# Patient Record
Sex: Female | Born: 1955
Health system: Southern US, Community
[De-identification: ages and names within clinical notes are randomized; demographics above are authoritative.]

## PROBLEM LIST (undated history)

## (undated) ENCOUNTER — Ambulatory Visit: Payer: Medicare HMO | Source: Home / Self Care

## (undated) DIAGNOSIS — I341 Nonrheumatic mitral (valve) prolapse: Secondary | ICD-10-CM

## (undated) DIAGNOSIS — I10 Essential (primary) hypertension: Secondary | ICD-10-CM

## (undated) DIAGNOSIS — M1990 Unspecified osteoarthritis, unspecified site: Secondary | ICD-10-CM

## (undated) DIAGNOSIS — M75100 Unspecified rotator cuff tear or rupture of unspecified shoulder, not specified as traumatic: Secondary | ICD-10-CM

## (undated) DIAGNOSIS — T7840XA Allergy, unspecified, initial encounter: Secondary | ICD-10-CM

## (undated) DIAGNOSIS — J45909 Unspecified asthma, uncomplicated: Secondary | ICD-10-CM

## (undated) HISTORY — PX: TONSILLECTOMY: SUR1361

## (undated) HISTORY — DX: Unspecified rotator cuff tear or rupture of unspecified shoulder, not specified as traumatic: M75.100

## (undated) HISTORY — PX: ABDOMINAL HYSTERECTOMY: SHX81

## (undated) HISTORY — PX: RETAINED PLACENTA REMOVAL: SHX2336

## (undated) HISTORY — PX: COLONOSCOPY: SHX174

---

## 1998-01-13 ENCOUNTER — Ambulatory Visit (HOSPITAL_COMMUNITY): Admission: RE | Admit: 1998-01-13 | Discharge: 1998-01-13 | Payer: Self-pay | Admitting: Obstetrics

## 1998-07-22 ENCOUNTER — Emergency Department (HOSPITAL_COMMUNITY): Admission: EM | Admit: 1998-07-22 | Discharge: 1998-07-22 | Payer: Self-pay | Admitting: Emergency Medicine

## 1998-07-22 ENCOUNTER — Encounter: Payer: Self-pay | Admitting: Emergency Medicine

## 1999-01-22 ENCOUNTER — Encounter: Payer: Self-pay | Admitting: Obstetrics

## 1999-01-22 ENCOUNTER — Ambulatory Visit (HOSPITAL_COMMUNITY): Admission: RE | Admit: 1999-01-22 | Discharge: 1999-01-22 | Payer: Self-pay | Admitting: Obstetrics

## 1999-01-27 ENCOUNTER — Encounter: Payer: Self-pay | Admitting: Obstetrics

## 1999-01-27 ENCOUNTER — Ambulatory Visit (HOSPITAL_COMMUNITY): Admission: RE | Admit: 1999-01-27 | Discharge: 1999-01-27 | Payer: Self-pay | Admitting: Obstetrics

## 2000-02-08 ENCOUNTER — Other Ambulatory Visit: Admission: RE | Admit: 2000-02-08 | Discharge: 2000-02-08 | Payer: Self-pay | Admitting: Obstetrics and Gynecology

## 2000-10-31 ENCOUNTER — Emergency Department (HOSPITAL_COMMUNITY): Admission: EM | Admit: 2000-10-31 | Discharge: 2000-10-31 | Payer: Self-pay | Admitting: Emergency Medicine

## 2001-03-31 ENCOUNTER — Ambulatory Visit (HOSPITAL_COMMUNITY): Admission: RE | Admit: 2001-03-31 | Discharge: 2001-03-31 | Payer: Self-pay | Admitting: Obstetrics

## 2001-03-31 ENCOUNTER — Encounter: Payer: Self-pay | Admitting: Obstetrics

## 2002-01-02 ENCOUNTER — Emergency Department (HOSPITAL_COMMUNITY): Admission: EM | Admit: 2002-01-02 | Discharge: 2002-01-02 | Payer: Self-pay | Admitting: Emergency Medicine

## 2002-04-02 ENCOUNTER — Ambulatory Visit (HOSPITAL_COMMUNITY): Admission: RE | Admit: 2002-04-02 | Discharge: 2002-04-02 | Payer: Self-pay | Admitting: Obstetrics

## 2002-04-02 ENCOUNTER — Encounter: Payer: Self-pay | Admitting: Obstetrics

## 2002-10-02 ENCOUNTER — Encounter (INDEPENDENT_AMBULATORY_CARE_PROVIDER_SITE_OTHER): Payer: Self-pay | Admitting: Specialist

## 2002-10-02 ENCOUNTER — Ambulatory Visit (HOSPITAL_COMMUNITY): Admission: RE | Admit: 2002-10-02 | Discharge: 2002-10-02 | Payer: Self-pay | Admitting: Obstetrics

## 2003-01-16 ENCOUNTER — Encounter: Payer: Self-pay | Admitting: Cardiology

## 2003-01-16 ENCOUNTER — Encounter: Admission: RE | Admit: 2003-01-16 | Discharge: 2003-01-16 | Payer: Self-pay | Admitting: Cardiology

## 2003-02-01 ENCOUNTER — Ambulatory Visit (HOSPITAL_BASED_OUTPATIENT_CLINIC_OR_DEPARTMENT_OTHER): Admission: RE | Admit: 2003-02-01 | Discharge: 2003-02-01 | Payer: Self-pay | Admitting: Cardiology

## 2003-04-12 ENCOUNTER — Emergency Department (HOSPITAL_COMMUNITY): Admission: EM | Admit: 2003-04-12 | Discharge: 2003-04-12 | Payer: Self-pay

## 2003-04-17 ENCOUNTER — Ambulatory Visit (HOSPITAL_COMMUNITY): Admission: RE | Admit: 2003-04-17 | Discharge: 2003-04-17 | Payer: Self-pay | Admitting: Obstetrics

## 2003-08-30 ENCOUNTER — Emergency Department (HOSPITAL_COMMUNITY): Admission: EM | Admit: 2003-08-30 | Discharge: 2003-08-30 | Payer: Self-pay | Admitting: Emergency Medicine

## 2003-12-05 ENCOUNTER — Inpatient Hospital Stay (HOSPITAL_COMMUNITY): Admission: RE | Admit: 2003-12-05 | Discharge: 2003-12-08 | Payer: Self-pay | Admitting: Obstetrics

## 2003-12-05 ENCOUNTER — Encounter (INDEPENDENT_AMBULATORY_CARE_PROVIDER_SITE_OTHER): Payer: Self-pay | Admitting: *Deleted

## 2004-05-06 ENCOUNTER — Ambulatory Visit (HOSPITAL_COMMUNITY): Admission: RE | Admit: 2004-05-06 | Discharge: 2004-05-06 | Payer: Self-pay | Admitting: Obstetrics

## 2004-06-19 ENCOUNTER — Emergency Department (HOSPITAL_COMMUNITY): Admission: EM | Admit: 2004-06-19 | Discharge: 2004-06-19 | Payer: Self-pay | Admitting: Family Medicine

## 2004-08-15 ENCOUNTER — Encounter: Admission: RE | Admit: 2004-08-15 | Discharge: 2004-08-15 | Payer: Self-pay | Admitting: Cardiology

## 2006-01-13 ENCOUNTER — Ambulatory Visit (HOSPITAL_COMMUNITY): Admission: RE | Admit: 2006-01-13 | Discharge: 2006-01-13 | Payer: Self-pay | Admitting: Obstetrics

## 2006-10-25 ENCOUNTER — Emergency Department (HOSPITAL_COMMUNITY): Admission: EM | Admit: 2006-10-25 | Discharge: 2006-10-25 | Payer: Self-pay | Admitting: Family Medicine

## 2007-02-17 ENCOUNTER — Ambulatory Visit (HOSPITAL_COMMUNITY): Admission: RE | Admit: 2007-02-17 | Discharge: 2007-02-17 | Payer: Self-pay | Admitting: Cardiology

## 2007-04-11 ENCOUNTER — Encounter: Admission: RE | Admit: 2007-04-11 | Discharge: 2007-04-11 | Payer: Self-pay | Admitting: Cardiology

## 2007-09-06 ENCOUNTER — Encounter (HOSPITAL_COMMUNITY): Admission: RE | Admit: 2007-09-06 | Discharge: 2007-09-07 | Payer: Self-pay | Admitting: Cardiology

## 2008-07-12 ENCOUNTER — Emergency Department (HOSPITAL_COMMUNITY): Admission: EM | Admit: 2008-07-12 | Discharge: 2008-07-12 | Payer: Self-pay | Admitting: Emergency Medicine

## 2010-06-14 ENCOUNTER — Emergency Department (HOSPITAL_COMMUNITY)
Admission: EM | Admit: 2010-06-14 | Discharge: 2010-06-14 | Payer: Self-pay | Source: Home / Self Care | Admitting: Emergency Medicine

## 2010-06-14 ENCOUNTER — Encounter: Payer: Self-pay | Admitting: Obstetrics

## 2010-06-15 ENCOUNTER — Encounter: Payer: Self-pay | Admitting: Cardiology

## 2010-06-22 ENCOUNTER — Observation Stay (HOSPITAL_COMMUNITY)
Admission: EM | Admit: 2010-06-22 | Discharge: 2010-06-23 | Payer: Self-pay | Source: Home / Self Care | Admitting: Emergency Medicine

## 2010-06-22 LAB — POCT CARDIAC MARKERS
CKMB, poc: <1 ng/mL — ABNORMAL LOW (ref 1.0–8.0)
CKMB, poc: <1 ng/mL — ABNORMAL LOW (ref 1.0–8.0)
CKMB, poc: <1 ng/mL — ABNORMAL LOW (ref 1.0–8.0)
Myoglobin, poc: 50 ng/mL (ref 12–200)
Myoglobin, poc: 45.8 ng/mL (ref 12–200)
Myoglobin, poc: 43.4 ng/mL (ref 12–200)
Troponin i, poc: <0.05 ng/mL (ref 0.00–0.09)
Troponin i, poc: <0.05 ng/mL (ref 0.00–0.09)
Troponin i, poc: <0.05 ng/mL (ref 0.00–0.09)

## 2010-06-22 LAB — CBC
HCT: 38 % (ref 36.0–46.0)
Hemoglobin: 13 g/dL (ref 12.0–15.0)
MCH: 28 pg (ref 26.0–34.0)
MCHC: 34.2 g/dL (ref 30.0–36.0)
MCV: 81.9 fL (ref 78.0–100.0)
Platelets: 199 10*3/uL (ref 150–400)
RBC: 4.64 MIL/uL (ref 3.87–5.11)
RDW: 14.6 % (ref 11.5–15.5)
WBC: 12.2 10*3/uL — ABNORMAL HIGH (ref 4.0–10.5)

## 2010-06-22 LAB — BASIC METABOLIC PANEL
BUN: 15 mg/dL (ref 6–23)
CO2: 32 mEq/L (ref 19–32)
Calcium: 8.9 mg/dL (ref 8.4–10.5)
Chloride: 100 mEq/L (ref 96–112)
Creatinine, Ser: 0.98 mg/dL (ref 0.4–1.2)
GFR calc Af Amer: >60 mL/min (ref >60)
GFR calc non Af Amer: 59 mL/min — ABNORMAL LOW (ref >60)
Glucose, Bld: 92 mg/dL (ref 70–99)
Potassium: 4.3 mEq/L (ref 3.5–5.1)
Sodium: 140 mEq/L (ref 135–145)

## 2010-06-22 LAB — DIFFERENTIAL
Basophils Absolute: 0 10*3/uL (ref 0.0–0.1)
Basophils Relative: 0 % (ref 0–1)
Eosinophils Absolute: 0.8 10*3/uL — ABNORMAL HIGH (ref 0.0–0.7)
Eosinophils Relative: 7 % — ABNORMAL HIGH (ref 0–5)
Lymphocytes Relative: 31 % (ref 12–46)
Lymphs Abs: 3.8 10*3/uL (ref 0.7–4.0)
Monocytes Absolute: 0.8 10*3/uL (ref 0.1–1.0)
Monocytes Relative: 7 % (ref 3–12)
Neutro Abs: 6.7 10*3/uL (ref 1.7–7.7)
Neutrophils Relative %: 55 % (ref 43–77)

## 2010-10-09 NOTE — Discharge Summary (Signed)
NAME:  DHALIA, ZINGARO                         ACCOUNT NO.:  [id number]   MEDICAL RECORD NO.:  [id number]                   PATIENT TYPE:  INP   LOCATION:  9303                                 FACILITY:  [facility]   PHYSICIAN:  Kathreen Cosier, M.D.           DATE OF BIRTH:  03/12/1956   DATE OF ADMISSION:  12/05/2003  DATE OF DISCHARGE:  12/08/2003                                 DISCHARGE SUMMARY   The patient is a 55 year old gravida 2, para 2-0-0-2 with a history of  dysfunctional uterine bleeding and myoma uteri who was in for TAH/BSO.  On  admission her hemoglobin was 13.9, postoperative 12.4.  Platelets 247,  postoperative 197.  White count 8.1.  PT/PTT normal.  INR 1.  Sodium 137,  potassium 4, chloride 101, CO2 28, BUN 5, creatinine 0.9.  Urine was  negative.  She was O+.  The patient underwent a TAH/BSO.  Blood loss was  approximately 100 mL.  Postoperatively she did well.  She was discharged  home on the third postoperative day, ambulatory, on a regular diet, on Tylox  for pain.  To see me in four weeks.   DISCHARGE DIAGNOSES:  Status post total abdominal hysterectomy, bilateral  salpingo-oophorectomy for myoma uteri.                                               Kathreen Cosier, M.D.    BAM/MEDQ  D:  12/08/2003  T:  12/08/2003  Job:  [id number]

## 2010-10-09 NOTE — Op Note (Signed)
   NAME:  Ellen Nelson, Ellen Nelson                         ACCOUNT NO.:  [id number]   MEDICAL RECORD NO.:  [id number]                   PATIENT TYPE:  AMB   LOCATION:  SDC                                  FACILITY:  [facility]   PHYSICIAN:  Kathreen Cosier, M.D.           DATE OF BIRTH:  02-24-1956   DATE OF PROCEDURE:  10/02/2002  DATE OF DISCHARGE:                                 OPERATIVE REPORT   PREOPERATIVE DIAGNOSIS:  Dysfunctional uterine bleeding.   POSTOPERATIVE DIAGNOSIS:  Subserosal myoma.   DESCRIPTION OF PROCEDURE:  Under general anesthesia, the patient was in  lithotomy position, perineum and vagina were prepped and draped, bladder  emptied with a straight catheter.  Bimanual exam revealed the uterus to be  12-14 weeks' size with multiple myomas.  A bivalve speculum was placed in  the vagina.  Anterior lip of the cervix grasped with a tenaculum and the  cervix was curetted with small amount of tissue obtained.  Endometrial  cavity sounded to 10 cm, and then the cervix was dilated to a #27 Shawnie Pons.  A  5 mm hysteroscope was inserted and the cavity distended with 3% sorbitol  solution.  The cavity appeared to be normal.  The scope was removed and  sharp curettage was performed.  There were no polyps or myomas in the  cavity, and a large amount of tissue was obtained.  After the sharp  curettage, the scope was reinserted, the cavity normal.  The fluid deficit  was 70 mL.  The patient tolerated the procedure well, was taken to the  recovery room in good condition.                                               Kathreen Cosier, M.D.    BAM/MEDQ  D:  10/02/2002  T:  10/03/2002  Job:  [id number]

## 2010-10-09 NOTE — Op Note (Signed)
NAME:  Ellen Nelson, Ellen Nelson                         ACCOUNT NO.:  [id number]   MEDICAL RECORD NO.:  [id number]                   PATIENT TYPE:  INP   LOCATION:  9303                                 [facility]ACILITY:  [facility]   PHYSICIAN:  Kathreen Cosier, M.D.           DATE OF BIRTH:  15-Mar-1956   DATE OF PROCEDURE:  12/05/2003  DATE OF DISCHARGE:                                 OPERATIVE REPORT   PREOPERATIVE DIAGNOSES:  1. Myoma uteri.  2. Dysfunctional uterine bleeding.   SURGEON:  Kathreen Cosier, M.D.   FIRST ASSISTANT:  Charles A. Clearance Coots, M.D.   ANESTHESIA:  General anesthesia.   DESCRIPTION OF PROCEDURE:  The patient placed on the operating table in the  supine position.  Abdomen prepped and draped.  Bladder emptied with Foley  catheter.  Transverse suprapubic incision made and carried down to the  rectus fascia.  Fascia cleaned and incised the length of the incision. Recti  muscles retracted laterally.  Peritoneum incised longitudinally.  The uterus  was enlarged with multiple myomas.  Ovaries were normal.  The right round  ligament was grasped with a Kelly clamp, cut and suture ligated with #1  chromic.  Procedure done in a similar fashion on the other side.  Using the  Metzenbaum scissors, the bladder flap was developed and the bladder  dissected off the cervix.  The right infundibulopelvic ligament was grasped  with a Kelly clamp, cut and suture ligated with #1 chromic.  Procedure done  in similar fashion on the other side.  Uterine vessels were skeletonized  bilaterally, doubly clamped on the right with Heaney clamps, cut and suture  ligated x2 with #1 chromic.  Procedure done in a similar fashion on the  other side.  Cardinal and uterosacral ligaments grasped with straight Kocher  clamps on the right, cut and suture ligated with #1 chromic.  Procedure done  in similar fashion on the other side.  Specimen consisting of uterus, tubes  and ovaries removed at the cervicovaginal  junction with Mayo scissors.  Modified Richardson sutures were placed in the angles of the vagina and then  the vaginal vault run with interlocking sutures of #1 chromic.  Hemostasis  was satisfactory.  The operative site was reperitonealized with 2-0 chromic.  Blood loss was 100 mL. The lap and sponge counts were correct.  The abdomen  closed in layers, peritoneum with continuous suture of 0 chromic, fascia  with continuous suture of 0 Dexon and the skin closed with subcuticular  suture of 4-0 Monocryl.  The patient tolerated the procedure well and was  taken to the recovery room in good condition.                                               Kathreen Cosier, M.D.  BAM/MEDQ  D:  12/05/2003  T:  12/05/2003  Job:  [id number]

## 2012-07-12 ENCOUNTER — Encounter (HOSPITAL_COMMUNITY): Payer: Self-pay

## 2012-07-12 ENCOUNTER — Emergency Department (INDEPENDENT_AMBULATORY_CARE_PROVIDER_SITE_OTHER)
Admission: EM | Admit: 2012-07-12 | Discharge: 2012-07-12 | Disposition: A | Discharge status: Home / Self Care | Payer: Self-pay | Source: Home / Self Care | Attending: Emergency Medicine | Admitting: Emergency Medicine

## 2012-07-12 DIAGNOSIS — J40 Bronchitis, not specified as acute or chronic: Secondary | ICD-10-CM

## 2012-07-12 DIAGNOSIS — J45909 Unspecified asthma, uncomplicated: Secondary | ICD-10-CM

## 2012-07-12 MED ORDER — SILVER SULFADIAZINE 1 % EX CREA: TOPICAL_CREAM | Freq: Every day | CUTANEOUS | Status: DC

## 2012-07-12 MED ORDER — PREDNISONE 20 MG PO TABS: 40.0000 mg | ORAL_TABLET | Freq: Every day | ORAL | Status: AC

## 2012-07-12 MED ORDER — AMOXICILLIN 500 MG PO CAPS: 500.0000 mg | ORAL_CAPSULE | Freq: Three times a day (TID) | ORAL | Status: AC

## 2012-07-12 MED ORDER — ALBUTEROL SULFATE HFA 108 (90 BASE) MCG/ACT IN AERS: 1.0000 | INHALATION_SPRAY | Freq: Four times a day (QID) | RESPIRATORY_TRACT | Status: DC | PRN

## 2012-07-12 NOTE — ED Notes (Signed)
Vitals obtained by xray student 

## 2012-07-12 NOTE — ED Provider Notes (Signed)
History     CSN: [id number]  Arrival date & time 07/12/12  1635   First MD Initiated Contact with Patient 07/12/12 1723      Chief Complaint  Patient presents with  . Cough    (Consider location/radiation/quality/duration/timing/severity/associated sxs/prior treatment) HPI Comments: Patient presents urgent care describing that she's been having cough tightness and wheezing for the last 3 weeks. Now she is coughing up yellow phlegm. His wheezing at night. Have some congestion and pressure in both of her sinuses some intermittent ear pains. Denies any fevers. And at this point has no shortness of breath.  Patient is a 57 y.o. female presenting with cough. The history is provided by the patient.  Cough Cough characteristics:  Productive Sputum characteristics:  Yellow Severity:  Moderate Onset quality:  Gradual Timing:  Intermittent Progression:  Waxing and waning Chronicity:  New Context: not exposure to allergens   Relieved by:  Nothing Worsened by:  Nothing tried Ineffective treatments:  None tried Associated symptoms: ear pain, rhinorrhea, shortness of breath, sinus congestion and wheezing   Associated symptoms: no chest pain, no chills, no diaphoresis, no fever and no rash     History reviewed. No pertinent past medical history.  History reviewed. No pertinent past surgical history.  History reviewed. No pertinent family history.  History  Substance Use Topics  . Smoking status: Not on file  . Smokeless tobacco: Not on file  . Alcohol Use: Not on file    OB History   Grav Para Term Preterm Abortions TAB SAB Ect Mult Living                  Review of Systems  Constitutional: Negative for fever, chills, diaphoresis, activity change, appetite change and fatigue.  HENT: Positive for ear pain, congestion and rhinorrhea. Negative for facial swelling, sneezing, neck pain, neck stiffness and postnasal drip.   Respiratory: Positive for cough, shortness of breath and  wheezing.   Cardiovascular: Negative for chest pain, palpitations and leg swelling.  Genitourinary: Negative for dysuria.  Skin: Negative for color change, pallor and rash.    Allergies  Review of patient's allergies indicates no known allergies.  Home Medications   Current Outpatient Rx  Name  Route  Sig  Dispense  Refill  . albuterol (PROVENTIL HFA;VENTOLIN HFA) 108 (90 BASE) MCG/ACT inhaler   Inhalation   Inhale 1-2 puffs into the lungs every 6 (six) hours as needed for wheezing.   1 Inhaler   0   . amoxicillin (AMOXIL) 500 MG capsule   Oral   Take 1 capsule (500 mg total) by mouth 3 (three) times daily.   30 capsule   0   . predniSONE (DELTASONE) 20 MG tablet   Oral   Take 2 tablets (40 mg total) by mouth daily. 2 tablets daily for 5 days   10 tablet   0     BP 157/90  Pulse 74  Temp(Src) 98.2 F (36.8 C) (Oral)  Resp 14  SpO2 99%  Physical Exam  Nursing note and vitals reviewed. Constitutional: Vital signs are normal. She appears well-developed and well-nourished.  Non-toxic appearance. She does not have a sickly appearance. She does not appear ill. No distress.  HENT:  Head: Normocephalic.  Eyes: Conjunctivae are normal. Right eye exhibits no discharge. Left eye exhibits no discharge. No scleral icterus.  Neck: Neck supple. No JVD present.  Pulmonary/Chest: Effort normal and breath sounds normal. No respiratory distress. She has no wheezes. She has no rales.  She exhibits no tenderness.  Abdominal: There is no tenderness.  Skin: No rash noted. No erythema.    ED Course  Procedures (including critical care time)  Labs Reviewed - No data to display No results found.   1. Bronchitis   2. Asthma       MDM  Asthmatic bronchitis. Patient had been prescribed bronchodilator, short course of prednisone for 5 days along with antibiotic cycle patient has been symptomatic for 3 weeks. No respiratory distress        Jimmie Molly, MD 07/12/12 [phone]

## 2012-07-12 NOTE — ED Notes (Signed)
C/o cough and congestion w yellow secretions off and on for past 3 weeks; looses voice : NAD at present

## 2012-07-26 ENCOUNTER — Encounter (HOSPITAL_COMMUNITY): Payer: Self-pay

## 2012-07-26 ENCOUNTER — Encounter (HOSPITAL_COMMUNITY): Payer: Self-pay | Admitting: *Deleted

## 2012-07-26 ENCOUNTER — Emergency Department (HOSPITAL_COMMUNITY)
Admission: EM | Admit: 2012-07-26 | Discharge: 2012-07-26 | Disposition: A | Discharge status: Home / Self Care | Payer: Self-pay | Attending: Emergency Medicine | Admitting: Emergency Medicine

## 2012-07-26 ENCOUNTER — Emergency Department (HOSPITAL_COMMUNITY): Payer: Self-pay

## 2012-07-26 ENCOUNTER — Emergency Department (INDEPENDENT_AMBULATORY_CARE_PROVIDER_SITE_OTHER)
Admission: EM | Admit: 2012-07-26 | Discharge: 2012-07-26 | Disposition: A | Discharge status: Other Acute Inpatient Hospital | Payer: Self-pay | Source: Home / Self Care

## 2012-07-26 DIAGNOSIS — R12 Heartburn: Secondary | ICD-10-CM | POA: Insufficient documentation

## 2012-07-26 DIAGNOSIS — Z87891 Personal history of nicotine dependence: Secondary | ICD-10-CM | POA: Insufficient documentation

## 2012-07-26 DIAGNOSIS — R079 Chest pain, unspecified: Secondary | ICD-10-CM

## 2012-07-26 DIAGNOSIS — R1013 Epigastric pain: Secondary | ICD-10-CM | POA: Insufficient documentation

## 2012-07-26 DIAGNOSIS — R0789 Other chest pain: Secondary | ICD-10-CM | POA: Insufficient documentation

## 2012-07-26 DIAGNOSIS — Z8679 Personal history of other diseases of the circulatory system: Secondary | ICD-10-CM | POA: Insufficient documentation

## 2012-07-26 HISTORY — DX: Nonrheumatic mitral (valve) prolapse: I34.1

## 2012-07-26 LAB — CBC WITH DIFFERENTIAL/PLATELET
Basophils Absolute: 0 10*3/uL (ref 0.0–0.1)
Basophils Relative: 0 % (ref 0–1)
Eosinophils Absolute: 0.3 10*3/uL (ref 0.0–0.7)
Eosinophils Relative: 3 % (ref 0–5)
HCT: 38.2 % (ref 36.0–46.0)
Hemoglobin: 13.5 g/dL (ref 12.0–15.0)
Lymphocytes Relative: 33 % (ref 12–46)
Lymphs Abs: 2.8 10*3/uL (ref 0.7–4.0)
MCH: 29 pg (ref 26.0–34.0)
MCHC: 35.3 g/dL (ref 30.0–36.0)
MCV: 82.2 fL (ref 78.0–100.0)
Monocytes Absolute: 0.8 10*3/uL (ref 0.1–1.0)
Monocytes Relative: 9 % (ref 3–12)
Neutro Abs: 4.8 10*3/uL (ref 1.7–7.7)
Neutrophils Relative %: 55 % (ref 43–77)
Platelets: 229 10*3/uL (ref 150–400)
RBC: 4.65 MIL/uL (ref 3.87–5.11)
RDW: 14.2 % (ref 11.5–15.5)
WBC: 8.6 10*3/uL (ref 4.0–10.5)

## 2012-07-26 LAB — COMPREHENSIVE METABOLIC PANEL
ALT: 18 U/L (ref 0–35)
AST: 27 U/L (ref 0–37)
Albumin: 4.1 g/dL (ref 3.5–5.2)
Alkaline Phosphatase: 110 U/L (ref 39–117)
BUN: 11 mg/dL (ref 6–23)
CO2: 29 mEq/L (ref 19–32)
Calcium: 9.7 mg/dL (ref 8.4–10.5)
Chloride: 99 mEq/L (ref 96–112)
Creatinine, Ser: 0.85 mg/dL (ref 0.50–1.10)
GFR calc Af Amer: 87 mL/min — ABNORMAL LOW (ref >90)
GFR calc non Af Amer: 75 mL/min — ABNORMAL LOW (ref >90)
Glucose, Bld: 92 mg/dL (ref 70–99)
Potassium: 3.8 mEq/L (ref 3.5–5.1)
Sodium: 139 mEq/L (ref 135–145)
Total Bilirubin: 0.4 mg/dL (ref 0.3–1.2)
Total Protein: 7.4 g/dL (ref 6.0–8.3)

## 2012-07-26 LAB — TROPONIN I: Troponin I: <0.3 ng/mL (ref <0.30)

## 2012-07-26 MED ORDER — GI COCKTAIL ~~LOC~~
30.0000 mL | Freq: Once | ORAL | Status: AC
  Administered 2012-07-26: 30 mL via ORAL

## 2012-07-26 MED ORDER — ASPIRIN 81 MG PO CHEW
CHEWABLE_TABLET | ORAL | Status: AC
  Filled 2012-07-26: qty 4

## 2012-07-26 MED ORDER — GI COCKTAIL ~~LOC~~
ORAL | Status: AC
  Filled 2012-07-26: qty 30

## 2012-07-26 MED ORDER — ASPIRIN EC 325 MG PO TBEC
325.0000 mg | DELAYED_RELEASE_TABLET | Freq: Once | ORAL | Status: AC
  Administered 2012-07-26: 325 mg via ORAL

## 2012-07-26 NOTE — ED Provider Notes (Signed)
History     CSN: [id number]  Arrival date & time 07/26/12  1504   First MD Initiated Contact with Patient 07/26/12 1907      Chief Complaint  Patient presents with  . Chest Pain    (Consider location/radiation/quality/duration/timing/severity/associated sxs/prior treatment) Patient is a 57 y.o. female presenting with chest pain.  Chest Pain  Pt reports she began to have epigastric burning pain while out of town 4-5 days ago. States pain moved into her chest at times, feels like bad heart burn/indigestion but not much improvement with numerous OTC remedies. She got a box of Prevacid from the pharmacy today but did not start it due to warnings and instead went to the [facility] to be evaluated. She was sent to the ED for further eval after neg EKG, ASA and GI cocktail. She is pain free now. No SOB, diaphoresis or arm pain.    Past Medical History  Diagnosis Date  . Mitral prolapse     History reviewed. No pertinent past surgical history.  No family history on file.  History  Substance Use Topics  . Smoking status: Former Games developer  . Smokeless tobacco: Not on file  . Alcohol Use: Yes    OB History   Grav Para Term Preterm Abortions TAB SAB Ect Mult Living                  Review of Systems  Cardiovascular: Positive for chest pain.   All other systems reviewed and are negative except as noted in HPI.   Allergies  Sulfa antibiotics  Home Medications  No current outpatient prescriptions on file.  BP 129/62  Pulse 61  Temp(Src) 98 F (36.7 C) (Oral)  Resp 16  SpO2 100%  Physical Exam  Nursing note and vitals reviewed. Constitutional: She is oriented to person, place, and time. She appears well-developed and well-nourished.  HENT:  Head: Normocephalic and atraumatic.  Eyes: EOM are normal. Pupils are equal, round, and reactive to light.  Neck: Normal range of motion. Neck supple.  Cardiovascular: Normal rate, normal heart sounds and intact distal pulses.    Pulmonary/Chest: Effort normal and breath sounds normal.  Abdominal: Bowel sounds are normal. She exhibits no distension. There is tenderness (mild epigastric). There is no rebound and no guarding.  Musculoskeletal: Normal range of motion. She exhibits no edema and no tenderness.  Neurological: She is alert and oriented to person, place, and time. She has normal strength. No cranial nerve deficit or sensory deficit.  Skin: Skin is warm and dry. No rash noted.  Psychiatric: She has a normal mood and affect.    ED Course  Procedures (including critical care time)  Labs Reviewed  COMPREHENSIVE METABOLIC PANEL - Abnormal; Notable for the following:    GFR calc non Af Amer 75 (*)    GFR calc Af Amer 87 (*)    All other components within normal limits  CBC WITH DIFFERENTIAL  TROPONIN I   Dg Chest 2 View  07/26/2012  *RADIOLOGY REPORT*  Clinical Data: Chest pain.  CHEST - 2 VIEW  Comparison: June 22, 2010.  Findings: Cardiomediastinal silhouette appears normal.  No acute pulmonary disease is noted.  Bony thorax is intact.  IMPRESSION: No acute cardiopulmonary abnormality seen.   Original Report Authenticated By: Lupita Raider.,  M.D.      No diagnosis found.    MDM   Date: 07/26/2012  Rate: 68  Rhythm: normal sinus rhythm  QRS Axis: normal  Intervals: normal  ST/T Wave abnormalities: normal  Conduction Disutrbances: none  Narrative Interpretation: unremarkable  Pt with normal EKG and normal troponin after several days of atypical pain, more likely to be GI in etiology. She has no CAD risk factors. Advised to take PPI at home and follow up with PCP. Return to the ED for any worsening pain, SOB or for any other concerns.           Charles B. Bernette Mayers, MD 07/26/12 Serena Croissant

## 2012-07-26 NOTE — ED Notes (Signed)
States she has been having pain in her epigastric area for past few days while she was out of town for funeral. Had been up late, eating late, laying down after she ate ( not her usual habit) pain relieved somewhat when she drank "soda water" and burped. Today she has pain in her mid chest and feels dizzy. Had just finished a course of antibiotics and steroids for bronchitis Rx here at [facility]. NAD at present warm, dry , color good

## 2012-07-26 NOTE — ED Notes (Signed)
Patient Demographics  Ellen Nelson, is a 57 y.o. female  RUE:[id number]  JYN:[id number]  DOB - 1955/06/09  Chief Complaint  Patient presents with  . Chest Pain        Subjective:   Ellen Nelson today has, No headache, 3 day history of substernal chest discomfort which radiates to her left arm and neck, makes her nauseated and sweaty, today she has been getting dizzy, pain is intermittent, denies any aggravating or relieving factors, currently pain-free, No new weakness tingling or numbness, No Cough - SOB.    Objective:    Filed Vitals:   07/26/12 1434  BP: 117/89  Pulse: 87  Temp: 97.8 F (36.6 C)  TempSrc: Oral  SpO2: 99%     Exam  Awake Alert, Oriented X 3, No new F.N deficits, Normal affect Isabel.AT,PERRAL Supple Neck,No JVD, No cervical lymphadenopathy appriciated.  Symmetrical Chest wall movement, Good air movement bilaterally, CTAB RRR,No Gallops,Rubs or new Murmurs, No Parasternal Heave +ve B.Sounds, Abd Soft, Non tender, No organomegaly appriciated, No rebound - guarding or rigidity. No Cyanosis, Clubbing or edema, No new Rash or bruise      Data Review   CBC No results found for this basename: WBC, HGB, HCT, PLT, MCV, MCH, MCHC, RDW, NEUTRABS, LYMPHSABS, MONOABS, EOSABS, BASOSABS, BANDABS, BANDSABD,  in the last 168 hours  Chemistries   No results found for this basename: NA, K, CL, CO2, GLUCOSE, BUN, CREATININE, GFRCGP, CALCIUM, MG, AST, ALT, ALKPHOS, BILITOT,  in the last 168 hours ------------------------------------------------------------------------------------------------------------------ No results found for this basename: HGBA1C,  in the last 72 hours ------------------------------------------------------------------------------------------------------------------ No results found for this basename: CHOL, HDL, LDLCALC, TRIG, CHOLHDL, LDLDIRECT,  in the last 72  hours ------------------------------------------------------------------------------------------------------------------ No results found for this basename: TSH, T4TOTAL, FREET3, T3FREE, THYROIDAB,  in the last 72 hours ------------------------------------------------------------------------------------------------------------------ No results found for this basename: VITAMINB12, FOLATE, FERRITIN, TIBC, IRON, RETICCTPCT,  in the last 72 hours  Coagulation profile  No results found for this basename: INR, PROTIME,  in the last 168 hours     Prior to Admission medications   Medication Sig Start Date End Date Taking? Authorizing Provider  albuterol (PROVENTIL HFA;VENTOLIN HFA) 108 (90 BASE) MCG/ACT inhaler Inhale 1-2 puffs into the lungs every 6 (six) hours as needed for wheezing. 07/12/12   Jimmie Molly, MD     Assessment & Plan    Substernal chest pain radiating to her left arm and neck, at times to her back, pain ongoing for 3 days, however today the pain is making patient nauseated, dizzy and at times break in sweat. Pain is intermittent and she is currently pain-free. EKG is unremarkable with sinus rhythm rate 68 beats a minute QTC 4 and 18 ms. No acute ST changes, patient has significant family history of coronary artery disease in her mom and her sister in their 71s. He is a multiple pack year smoker who quit smoking 2 years ago.   This time patient will be given full dose aspirin, she is hematologically stable, will be transported immediately to [facility] next door, she at least needs serial troponins and EKG, thereafter cardiology input for further testing.    Follow-up Information   Follow up with Primary care provider. Schedule an appointment as soon as possible for a visit in 2 weeks.       Leroy Sea M.D on 07/26/2012 at 2:44 PM  Leroy Sea, MD 07/26/12 [phone]

## 2012-07-26 NOTE — ED Notes (Signed)
The pt has had lt upper chest pain since Monday and feeling like indigestion.  She has also had some dizziness.  She was sent here from [facility] had an ekg and was given 4 baby aspirin.  No acute distress

## 2012-07-26 NOTE — ED Notes (Signed)
Pt is in a gown and hooked up to the monitor. Urine sample at bedside if needed

## 2017-09-12 ENCOUNTER — Ambulatory Visit (HOSPITAL_COMMUNITY)
Admission: EM | Admit: 2017-09-12 | Discharge: 2017-09-12 | Disposition: A | Discharge status: Home / Self Care | Payer: BLUE CROSS/BLUE SHIELD | Attending: Emergency Medicine | Admitting: Emergency Medicine

## 2017-09-12 ENCOUNTER — Encounter (HOSPITAL_COMMUNITY): Payer: Self-pay

## 2017-09-12 ENCOUNTER — Ambulatory Visit (INDEPENDENT_AMBULATORY_CARE_PROVIDER_SITE_OTHER): Payer: BLUE CROSS/BLUE SHIELD

## 2017-09-12 ENCOUNTER — Other Ambulatory Visit: Payer: Self-pay

## 2017-09-12 DIAGNOSIS — H6123 Impacted cerumen, bilateral: Secondary | ICD-10-CM | POA: Diagnosis not present

## 2017-09-12 DIAGNOSIS — J209 Acute bronchitis, unspecified: Secondary | ICD-10-CM | POA: Diagnosis not present

## 2017-09-12 DIAGNOSIS — R05 Cough: Secondary | ICD-10-CM

## 2017-09-12 MED ORDER — AZITHROMYCIN 250 MG PO TABS: 250.0000 mg | ORAL_TABLET | Freq: Every day | ORAL | 0 refills | Status: DC

## 2017-09-12 NOTE — ED Provider Notes (Signed)
[facility]    CSN: [id number] Arrival date & time: 09/12/17  1926     History   Chief Complaint Chief Complaint  Patient presents with  . Cough    HPI Ellen Nelson is a 62 y.o. female.   62 year old female presents with four-week history of cough, was seen by her PCP 4 weeks ago, diagnosed with URI, started on antibiotics. She is unsure what these were. Note from her PCP not evident in epic  The history is provided by the patient.  Cough  Cough characteristics:  Productive Sputum characteristics:  Green Severity:  Moderate Onset quality:  Gradual Duration:  4 weeks Timing:  Constant Progression:  Waxing and waning Chronicity:  New Smoker: no   Context: upper respiratory infection   Context: not animal exposure, not exposure to allergens and not smoke exposure   Relieved by:  None tried Worsened by:  Nothing Ineffective treatments:  None tried Associated symptoms: no chest pain, no chills, no ear pain, no fever, no headaches, no shortness of breath, no sore throat and no wheezing   Risk factors: recent infection     Past Medical History:  Diagnosis Date  . Mitral prolapse     There are no active problems to display for this patient.   History reviewed. No pertinent surgical history.  OB History   None      Home Medications    Prior to Admission medications   Medication Sig Start Date End Date Taking? Authorizing Provider  Bioflavonoid Products (BIOFLEX PO) Take by mouth.   Yes [provider]  Cholecalciferol (VITAMIN D) 2000 units CAPS Take by mouth.   Yes [provider]  fexofenadine (ALLEGRA) 60 MG tablet Take 60 mg by mouth 2 (two) times daily.   Yes [provider]  losartan (COZAAR) 100 MG tablet Take 100 mg by mouth daily.   Yes [provider]  Omega-3 Fatty Acids (FISH OIL) 1000 MG CAPS Take by mouth.   Yes [provider]  thiamine (VITAMIN B-1) 100 MG tablet Take 100 mg by mouth  daily.   Yes [provider]  azithromycin (ZITHROMAX) 250 MG tablet Take 1 tablet (250 mg total) by mouth daily. Take first 2 tablets together, then 1 every day until finished. 09/12/17   Dorena BodoKennard, Lawrence, NP    Family History Family History  Problem Relation Age of Onset  . Dementia Father     Social History Social History   Tobacco Use  . Smoking status: Former Games developermoker  . Smokeless tobacco: Never Used  Substance Use Topics  . Alcohol use: Yes  . Drug use: Not on file     Allergies   Sulfa antibiotics   Review of Systems Review of Systems  Constitutional: Negative for chills and fever.  HENT: Negative for ear pain and sore throat.   Respiratory: Positive for cough. Negative for shortness of breath and wheezing.   Cardiovascular: Negative for chest pain.  Neurological: Negative for headaches.     Physical Exam Triage Vital Signs ED Triage Vitals  Enc Vitals Group     BP 09/12/17 1950 (!) 148/76     Pulse Rate 09/12/17 1950 86     Resp 09/12/17 1950 18     Temp 09/12/17 1950 98.7 F (37.1 C)     Temp src --      SpO2 09/12/17 1950 99 %     Weight 09/12/17 1949 219 lb (99.3 kg)     Height --  Head Circumference --      Peak Flow --      Pain Score 09/12/17 1949 0     Pain Loc --      Pain Edu? --      Excl. in GC? --    No data found.  Updated Vital Signs BP (!) 148/76   Pulse 86   Temp 98.7 F (37.1 C)   Resp 18   Wt 219 lb (99.3 kg)   SpO2 99%   Visual Acuity Right Eye Distance:   Left Eye Distance:   Bilateral Distance:    Right Eye Near:   Left Eye Near:    Bilateral Near:     Physical Exam  Constitutional: She is oriented to person, place, and time. She appears well-developed and well-nourished. No distress.  HENT:  Head: Normocephalic.  Right Ear: External ear normal.  Left Ear: External ear normal.  Mouth/Throat: Oropharynx is clear and moist.  Bilateral cerumen impaction  Eyes: Conjunctivae are normal.  Neck:  Normal range of motion.  Cardiovascular: Normal rate and regular rhythm.  Pulmonary/Chest: Effort normal and breath sounds normal.  Abdominal: Soft. Bowel sounds are normal. She exhibits no distension. There is no tenderness.  Lymphadenopathy:    She has no cervical adenopathy.  Neurological: She is alert and oriented to person, place, and time.  Skin: Skin is warm and dry. She is not diaphoretic.  Nursing note and vitals reviewed.    UC Treatments / Results  Labs (all labs ordered are listed, but only abnormal results are displayed) Labs Reviewed - No data to display  EKG None Radiology Dg Chest 2 View  Result Date: 09/12/2017 CLINICAL DATA:  Cough for 4 weeks EXAM: CHEST - 2 VIEW COMPARISON:  07/26/2012 FINDINGS: Mild bronchitic changes. No consolidation or effusion. Normal cardiomediastinal silhouette with aortic atherosclerosis. No pneumothorax. IMPRESSION: No active cardiopulmonary disease. Electronically Signed   By: Jasmine Pang M.D.   On: 09/12/2017 20:48    Procedures Procedures (including critical care time)  Medications Ordered in UC Medications - No data to display   Initial Impression / Assessment and Plan / UC Course  I have reviewed the triage vital signs and the nursing notes.  Pertinent labs & imaging results that were available during my care of the patient were reviewed by me and considered in my medical decision making (see chart for details).     Cerumen impaction cleared in clinic, tympanic membranes intact without evidence of trauma. With cough ongoing for 4 weeks, x-ray results reviewed, no sign of pneumonia or other cardiopulmonary disease. We'll treat empirically for bronchitis. Follow-up with primary care provider as needed.  Final Clinical Impressions(s) / UC Diagnoses   Final diagnoses:  Bilateral impacted cerumen  Acute bronchitis, unspecified organism    ED Discharge Orders        Ordered    azithromycin (ZITHROMAX) 250 MG tablet   Daily     09/12/17 2059       Controlled Substance Prescriptions [place] Controlled Substance Registry consulted? Not Applicable   Dorena Bodo, NP 09/12/17 2104

## 2017-09-12 NOTE — ED Triage Notes (Signed)
Pt presents for productive cough x 3 months.

## 2018-01-10 ENCOUNTER — Ambulatory Visit (INDEPENDENT_AMBULATORY_CARE_PROVIDER_SITE_OTHER): Payer: BLUE CROSS/BLUE SHIELD | Admitting: Orthopaedic Surgery

## 2018-01-10 ENCOUNTER — Ambulatory Visit (INDEPENDENT_AMBULATORY_CARE_PROVIDER_SITE_OTHER): Payer: BLUE CROSS/BLUE SHIELD

## 2018-01-10 DIAGNOSIS — M25511 Pain in right shoulder: Secondary | ICD-10-CM | POA: Diagnosis not present

## 2018-01-10 DIAGNOSIS — G8929 Other chronic pain: Secondary | ICD-10-CM | POA: Diagnosis not present

## 2018-01-10 DIAGNOSIS — M545 Low back pain: Secondary | ICD-10-CM

## 2018-01-10 MED ORDER — MELOXICAM 7.5 MG PO TABS: 7.5000 mg | ORAL_TABLET | Freq: Two times a day (BID) | ORAL | 2 refills | Status: DC | PRN

## 2018-01-10 MED ORDER — METHYLPREDNISOLONE ACETATE 40 MG/ML IJ SUSP
40.0000 mg | INTRAMUSCULAR | Status: AC | PRN
  Administered 2018-01-10: 40 mg via INTRA_ARTICULAR

## 2018-01-10 MED ORDER — LIDOCAINE HCL 1 % IJ SOLN
3.0000 mL | INTRAMUSCULAR | Status: AC | PRN
  Administered 2018-01-10: 3 mL

## 2018-01-10 MED ORDER — BUPIVACAINE HCL 0.5 % IJ SOLN
3.0000 mL | INTRAMUSCULAR | Status: AC | PRN
  Administered 2018-01-10: 3 mL via INTRA_ARTICULAR
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
## 2018-01-15 ENCOUNTER — Emergency Department (HOSPITAL_COMMUNITY)
Admission: EM | Admit: 2018-01-15 | Discharge: 2018-01-15 | Disposition: A | Discharge status: Home / Self Care | Payer: BLUE CROSS/BLUE SHIELD | Attending: Emergency Medicine | Admitting: Emergency Medicine

## 2018-01-15 ENCOUNTER — Encounter (HOSPITAL_COMMUNITY): Payer: Self-pay

## 2018-01-15 DIAGNOSIS — R42 Dizziness and giddiness: Secondary | ICD-10-CM | POA: Diagnosis not present

## 2018-01-15 DIAGNOSIS — Z79899 Other long term (current) drug therapy: Secondary | ICD-10-CM | POA: Insufficient documentation

## 2018-01-15 DIAGNOSIS — I10 Essential (primary) hypertension: Secondary | ICD-10-CM | POA: Insufficient documentation

## 2018-01-15 DIAGNOSIS — Z87891 Personal history of nicotine dependence: Secondary | ICD-10-CM | POA: Insufficient documentation

## 2018-01-15 HISTORY — DX: Essential (primary) hypertension: I10

## 2018-01-15 LAB — BASIC METABOLIC PANEL
Anion gap: 6 (ref 5–15)
BUN: 15 mg/dL (ref 8–23)
CO2: 33 mmol/L — ABNORMAL HIGH (ref 22–32)
Calcium: 8.8 mg/dL — ABNORMAL LOW (ref 8.9–10.3)
Chloride: 101 mmol/L (ref 98–111)
Creatinine, Ser: 0.89 mg/dL (ref 0.44–1.00)
GFR calc Af Amer: >60 mL/min (ref >60)
GFR calc non Af Amer: >60 mL/min (ref >60)
Glucose, Bld: 95 mg/dL (ref 70–99)
Potassium: 4.3 mmol/L (ref 3.5–5.1)
Sodium: 140 mmol/L (ref 135–145)

## 2018-01-15 LAB — URINALYSIS, ROUTINE W REFLEX MICROSCOPIC
Bilirubin Urine: NEGATIVE
Glucose, UA: NEGATIVE mg/dL
Hgb urine dipstick: NEGATIVE
Ketones, ur: NEGATIVE mg/dL
Nitrite: NEGATIVE
Protein, ur: NEGATIVE mg/dL
Specific Gravity, Urine: 1.003 — ABNORMAL LOW (ref 1.005–1.030)
pH: 8 (ref 5.0–8.0)

## 2018-01-15 LAB — CBC
HCT: 38 % (ref 36.0–46.0)
Hemoglobin: 12.3 g/dL (ref 12.0–15.0)
MCH: 28.1 pg (ref 26.0–34.0)
MCHC: 32.4 g/dL (ref 30.0–36.0)
MCV: 87 fL (ref 78.0–100.0)
Platelets: 215 10*3/uL (ref 150–400)
RBC: 4.37 MIL/uL (ref 3.87–5.11)
RDW: 14.6 % (ref 11.5–15.5)
WBC: 8.9 10*3/uL (ref 4.0–10.5)

## 2018-01-15 LAB — CBG MONITORING, ED: Glucose-Capillary: 87 mg/dL (ref 70–99)

## 2018-01-15 MED ORDER — SODIUM CHLORIDE 0.9 % IV BOLUS
500.0000 mL | Freq: Once | INTRAVENOUS | Status: AC
  Administered 2018-01-15: 500 mL via INTRAVENOUS

## 2018-01-15 NOTE — ED Notes (Signed)
Pt ambulatory to bathroom

## 2018-01-15 NOTE — ED Provider Notes (Signed)
Medical screening examination/treatment/procedure(s) were conducted as a shared visit with non-physician practitioner(s) and myself.  I personally evaluated the patient during the encounter.  EKG Interpretation  Date/Time:  Sunday January 15 2018 12:39:30 EDT Ventricular Rate:  56 PR Interval:  126 QRS Duration: 90 QT Interval:  398 QTC Calculation: 384 R Axis:   47 Text Interpretation:  Sinus bradycardia Septal infarct , age undetermined Abnormal ECG No significant change since last tracing Confirmed by Lorre NickAllen, Anthony ([id number]) on 01/15/2018 1:6404:[id number] PM 62 year old female presents with dizziness that is worse when she gets up in the morning and gets better throughout the day.  Patient has had the symptoms for an extended period of time is not been given a diagnosis and has been seen by primary care doctor with a negative work-up.  Work-up here today is reassuring suspect that she probably has some component of hypertension when she wakes up.  Instructed to follow-up with her doctor.   Lorre NickAllen, Anthony, MD 01/15/18 [phone]

## 2018-01-15 NOTE — Discharge Instructions (Signed)
Please follow up with your primary care provider within 5-7 days for re-evaluation of your symptoms. If you do not have a primary care provider, information for a healthcare clinic has been provided for you to make arrangements for follow up care. Please return to the emergency department for any new or worsening symptoms. ° °

## 2018-01-15 NOTE — ED Triage Notes (Signed)
Pt started new BP med, Losartan about 3-4 weeks ago.  Pt started having dizziness, seen PCP,blood work obtained, was told no concerns.  Pt still having dizziness that is worsening.  Pt describes as room spinning, sometimes has to hold onto something when walking.

## 2018-01-15 NOTE — ED Provider Notes (Signed)
[facility] Provider Note   CSN: [id number] Arrival date & time: 01/15/18  1229     History   Chief Complaint Chief Complaint  Patient presents with  . Dizziness    HPI Ellen Nelson is a 62 y.o. female.  HPI   Patient is a 62 year old female with history of hypertension, mitral valve prolapse who presents to the emergency department today for evaluation of lightheadedness/dizziness that has been present intermittently for the last month.  Patient states that symptoms occur about 2-3 times per week.  Symptoms seem to be worse when she is up moving around and resolves after period of sitting.  Denies a feeling of room spinning.  States that today she had an episode which lasted longer than usual and lasted for several hours.  She denies any syncope but stated that she felt like she was going to pass out.  Denies any chest pain, shortness of breath, palpitations, numbness or weakness to the arms or legs.  Is also had intermittent headaches as well but none currently.  Reports that she has had some shortness of breath when she talks too much however none currently.  Denies dyspnea on exertion. Denies any difficulty walking or abnormal balance. No melena, hematochezia. Reports that she works long hours 7 days per week and is on her feet the entire time she is at work. Sometimes she is not able to eat or drink while on shift.   She was seen at Dr. Annitta JerseyHarwani's office about 2 to 3 weeks ago for her symptoms and had lab work drawn that she was told was normal.  Has had intermittent bilat ankle swelling that seems to be present after she is at work for long periods of time. Resolves after elevating legs. Denies leg pain, hemoptysis, recent surgery/trauma, recent long travel, hormone use, personal hx of cancer, or hx of DVT/PE.   Past Medical History:  Diagnosis Date  . Hypertension   . Mitral prolapse     There are no active problems to display for this  patient.   History reviewed. No pertinent surgical history.   OB History   None      Home Medications    Prior to Admission medications   Medication Sig Start Date End Date Taking? Authorizing Provider  cholecalciferol (VITAMIN D) 1000 units tablet Take 2,000 Units by mouth daily.    Yes [provider]  fexofenadine (ALLEGRA) 60 MG tablet Take 60 mg by mouth daily.    Yes [provider]  losartan (COZAAR) 100 MG tablet Take 100 mg by mouth daily.   Yes [provider]  meloxicam (MOBIC) 7.5 MG tablet Take 1 tablet (7.5 mg total) by mouth 2 (two) times daily as needed for pain. Patient taking differently: Take 7.5 mg by mouth 2 (two) times daily.  01/10/18  Yes Tarry KosXu, Naiping M, MD  Multiple Vitamins-Minerals (HAIR SKIN AND NAILS FORMULA PO) Take 1 tablet by mouth daily.   Yes [provider]  polycarbophil (FIBERCON) 625 MG tablet Take 1,250 mg by mouth 2 (two) times daily.   Yes [provider]  vitamin B-12 (CYANOCOBALAMIN) 100 MCG tablet Take 100 mcg by mouth daily.   Yes [provider]  vitamin C (ASCORBIC ACID) 500 MG tablet Take 500 mg by mouth daily.   Yes [provider]  azithromycin (ZITHROMAX) 250 MG tablet Take 1 tablet (250 mg total) by mouth daily. Take first 2 tablets together, then 1 every day  until finished. Patient not taking: Reported on 01/15/2018 09/12/17   Dorena Bodo, NP    Family History Family History  Problem Relation Age of Onset  . Dementia Father     Social History Social History   Tobacco Use  . Smoking status: Former Games developer  . Smokeless tobacco: Never Used  Substance Use Topics  . Alcohol use: Yes    Frequency: Never    Comment: seldom  . Drug use: Never     Allergies   Sulfa antibiotics   Review of Systems Review of Systems  Constitutional: Negative for chills and fever.  HENT: Negative for ear pain and sore throat.   Eyes: Negative for pain and visual disturbance.   Respiratory: Positive for shortness of breath (resolved). Negative for cough.   Cardiovascular: Negative for chest pain and palpitations.  Gastrointestinal: Negative for abdominal pain, constipation, diarrhea, nausea and vomiting.  Genitourinary: Negative for dysuria and hematuria.  Musculoskeletal: Negative for back pain.  Skin: Negative for color change and rash.  Neurological: Positive for light-headedness and headaches (resolved). Negative for weakness and numbness.       Denies vertigo  All other systems reviewed and are negative.  Physical Exam Updated Vital Signs BP (!) 152/86   Pulse 78   Temp 98 F (36.7 C) (Oral)   Resp 12   SpO2 100%   Physical Exam  Constitutional: She is oriented to person, place, and time. She appears well-developed and well-nourished. No distress.  HENT:  Head: Normocephalic and atraumatic.  Eyes: Pupils are equal, round, and reactive to light. Conjunctivae and EOM are normal.  No nystagmus  Neck: Neck supple.  Negative carotid bruits  Cardiovascular: Normal rate, regular rhythm, normal heart sounds and intact distal pulses.  No murmur heard. Pulmonary/Chest: Effort normal and breath sounds normal. No stridor. No respiratory distress. She has no wheezes.  Abdominal: Soft. There is no tenderness.  Musculoskeletal: She exhibits no edema.  Neurological: She is alert and oriented to person, place, and time.  Mental Status:  Alert, thought content appropriate, able to give a coherent history. Speech fluent without evidence of aphasia. Able to follow 2 step commands without difficulty.  Cranial Nerves:  II:  pupils equal, round, reactive to light III,IV, VI: ptosis not present, extra-ocular motions intact bilaterally  V,VII: smile symmetric, facial light touch sensation equal VIII: hearing grossly normal to voice  X: uvula elevates symmetrically  XI: bilateral shoulder shrug symmetric and strong XII: midline tongue extension without  fassiculations Motor:  Normal tone. 5/5 strength of BUE and BLE major muscle groups including strong and equal grip strength and dorsiflexion/plantar flexion Sensory: light touch normal in all extremities. Cerebellar: normal finger-to-nose with bilateral upper extremities, normal heel to shin Gait: normal gait and balance.  CV: 2+ radial and DP/PT pulses Negative pronator drift, negative romberg  Skin: Skin is warm and dry. Capillary refill takes less than 2 seconds.  Psychiatric: She has a normal mood and affect.  Nursing note and vitals reviewed.  ED Treatments / Results  Labs (all labs ordered are listed, but only abnormal results are displayed) Labs Reviewed  BASIC METABOLIC PANEL - Abnormal; Notable for the following components:      Result Value   CO2 33 (*)    Calcium 8.8 (*)    All other components within normal limits  URINALYSIS, ROUTINE W REFLEX MICROSCOPIC - Abnormal; Notable for the following components:   Color, Urine STRAW (*)    Specific Gravity, Urine 1.003 (*)  Leukocytes, UA LARGE (*)    Bacteria, UA RARE (*)    All other components within normal limits  URINE CULTURE  CBC  CBG MONITORING, ED    EKG EKG Interpretation  Date/Time:  Sunday January 15 2018 12:39:30 EDT Ventricular Rate:  56 PR Interval:  126 QRS Duration: 90 QT Interval:  398 QTC Calculation: 384 R Axis:   47 Text Interpretation:  Sinus bradycardia Septal infarct , age undetermined Abnormal ECG No significant change since last tracing Confirmed by Lorre Nick ([id number]) on 01/15/2018 1:04:07 PM   Radiology No results found.  Procedures Procedures (including critical care time)  Medications Ordered in ED Medications  sodium chloride 0.9 % bolus 500 mL (0 mLs Intravenous Stopped 01/15/18 1616)     Initial Impression / Assessment and Plan / ED Course  I have reviewed the triage vital signs and the nursing notes.  Pertinent labs & imaging results that were available during my  care of the patient were reviewed by me and considered in my medical decision making (see chart for details).    Discussed pt presentation and exam findings with Dr. Freida Busman, who evaluated the pt an advises to discharge the patient with outpatient f/u. Pt informed Dr. Freida Busman that sxs seem to only be present in the morning and improve throughout the day. Sxs are likely related to BP.   Final Clinical Impressions(s) / ED Diagnoses   Final diagnoses:  Lightheadedness   Patient presenting with lightheadedness and near syncope symptoms that seem be worse in the morning and resolved throughout the day.  Denies vertigo or room spinning sensation.  Not associated with chest pain or shortness of breath.  Patient's vitals stable here in the ED.  Neurologic exam is within normal limits.  Has steady gait without ataxia.  Cardiac and pulmonary exams are also reassuring.  Cbc without anemia or leukocytosis. BMP with normal electrolytes and kidney function. UA with leukocytes, 21-50 WBC, and rare bacteria. Pt is asymptomatic do not suspect UTI.  Will send for culture.  ECG with sinus bradycardia, HR 56. Septal infarct. No changes from prior.   Patient symptoms are less likely of neurologic cause.  Suspect symptoms may be due to low blood pressure in the mornings after taking medications.  Patient was advised to monitor her blood pressures in the morning and follow-up closely with her PCP for reevaluation of her symptoms.  She is advised to return to the ER for any new or worsening symptoms in the meantime.  Patient voiced an understanding of plan reasons to return immediately to the ED.  All questions answered.  ED Discharge Orders    None       Rayne Du 01/15/18 2330    Lorre Nick, MD 01/17/18 1537

## 2018-01-15 NOTE — ED Notes (Signed)
Discharge instructions discussed with Pt. Pt verbalized understanding. Pt stable and ambulatory.    

## 2018-01-15 NOTE — ED Notes (Signed)
Computer in room not working. Patient verified with 2 identifiers.

## 2018-01-16 LAB — URINE CULTURE

## 2018-01-20 ENCOUNTER — Telehealth (INDEPENDENT_AMBULATORY_CARE_PROVIDER_SITE_OTHER): Payer: Self-pay

## 2018-01-20 NOTE — Telephone Encounter (Signed)
Patient called and stated the injection didn't help her shoulder at all, she states the pain is worse then it has ever been. States it's a constant throb and hurts to raise arm. She is aware it will be Tuesday before this message gets answered but is asking "what is the next step"?

## 2018-01-24 ENCOUNTER — Other Ambulatory Visit (INDEPENDENT_AMBULATORY_CARE_PROVIDER_SITE_OTHER): Payer: Self-pay

## 2018-01-24 DIAGNOSIS — G8929 Other chronic pain: Secondary | ICD-10-CM

## 2018-01-24 DIAGNOSIS — M25511 Pain in right shoulder: Principal | ICD-10-CM

## 2018-01-24 NOTE — Telephone Encounter (Signed)
MRI right shoulder r/o structural abnormalities.

## 2018-01-24 NOTE — Telephone Encounter (Signed)
ORDER MADE 

## 2018-01-24 NOTE — Telephone Encounter (Signed)
Called patient she is aware.

## 2018-01-24 NOTE — Telephone Encounter (Signed)
See message below °

## 2018-02-13 ENCOUNTER — Ambulatory Visit
Admission: RE | Admit: 2018-02-13 | Discharge: 2018-02-13 | Disposition: A | Discharge status: Home / Self Care | Payer: BLUE CROSS/BLUE SHIELD | Source: Ambulatory Visit | Attending: Orthopaedic Surgery | Admitting: Orthopaedic Surgery

## 2018-02-13 DIAGNOSIS — G8929 Other chronic pain: Secondary | ICD-10-CM

## 2018-02-13 DIAGNOSIS — M25511 Pain in right shoulder: Principal | ICD-10-CM

## 2018-02-14 ENCOUNTER — Encounter (INDEPENDENT_AMBULATORY_CARE_PROVIDER_SITE_OTHER): Payer: Self-pay | Admitting: Orthopaedic Surgery

## 2018-02-14 ENCOUNTER — Ambulatory Visit (INDEPENDENT_AMBULATORY_CARE_PROVIDER_SITE_OTHER): Payer: BLUE CROSS/BLUE SHIELD | Admitting: Orthopaedic Surgery

## 2018-02-14 DIAGNOSIS — M7541 Impingement syndrome of right shoulder: Secondary | ICD-10-CM

## 2018-02-14 DIAGNOSIS — M75101 Unspecified rotator cuff tear or rupture of right shoulder, not specified as traumatic: Secondary | ICD-10-CM

## 2018-02-14 NOTE — Progress Notes (Signed)
Office Visit Note   Patient: Ellen Nelson           Date of Birth: January 13, 1956           MRN: [id number] Visit Date: 02/14/2018              Requested by: Rinaldo Cloud, MD [phone] [address] [place], [place] [address] PCP: Rinaldo Cloud, MD   Assessment & Plan: Visit Diagnoses:  1. Impingement syndrome of right shoulder   2. Tear of right supraspinatus tendon     Plan: Impression is full-thickness minimally retracted supraspinatus tear and impingement syndrome with a small biceps tear and degenerative labral tearing.  These MRI findings were reviewed with the patient.  Recommendations for rotator cuff repair and debridements and possible biceps tenodesis.  Risks and benefits and postoperative rehab recovery were reviewed with the patient.  She understands and wishes to proceed.  She will call us in the near future to schedule.  Follow-Up Instructions: Return if symptoms worsen or fail to improve.   Orders:  No orders of the defined types were placed in this encounter.  No orders of the defined types were placed in this encounter.     Procedures: No procedures performed   Clinical Data: No additional findings.   Subjective: Chief Complaint  Patient presents with  . Right Shoulder - Pain, Follow-up    Mohini returns today for MRI review.  She continues to have chronic pain in her right shoulder that limits her ability to perform her ADLs and her job.  She does a significant amount of lifting at work.   Review of Systems   Objective: Vital Signs: There were no vitals taken for this visit.  Physical Exam  Ortho Exam Right shoulder exam shows positive empty can sign.  Positive impingement. Specialty Comments:  No specialty comments available.  Imaging: Mr Shoulder Right Wo Contrast  Result Date: 02/14/2018 CLINICAL DATA:  Chronic right shoulder pain with limited range of motion. EXAM: MRI OF THE RIGHT SHOULDER WITHOUT CONTRAST TECHNIQUE:  Multiplanar, multisequence MR imaging of the shoulder was performed. No intravenous contrast was administered. COMPARISON:  None. FINDINGS: Rotator cuff: There is a full-thickness transverse tear of the distal supraspinatus tendon with slight retraction of the anterior aspect of the tear. The tear is approximately 25 mm in width. Infraspinatus, teres minor, and subscapularis tendons are intact. Muscles: No atrophy. Slight edema around the distal supraspinatus muscle. Biceps long head: The intra-articular portion of the long head of the biceps tendon is not visualized. The proximal portion of the long head of the biceps tendon in the bicipital groove is markedly diminutive. I suspect there is a tiny intra-articular intact remnant of the tendon. Acromioclavicular Joint: Moderate AC joint arthropathy. Type 2 acromion. Glenohumeral Joint: Small glenohumeral joint effusion. No discrete chondral defect. Labrum: There is severe degeneration of the superior labrum. I suspect there is an extensive SLAP tear but the labrum is so poorly defined that a discrete tear is not appreciable. Bones: There are focal cystic degenerative changes of the anterior and posterior aspects of the greater tuberosity of the proximal humerus. Other: None IMPRESSION: 1. Transverse full-thickness tear of the distal supraspinatus tendon without significant retraction. 2. Severe degeneration of the superior labrum. I suspect the superior labrum is diffusely torn. 3. Marked attenuation of the proximal long head of the biceps tendon as described above. Electronically Signed   By: Francene Boyers M.D.   On: 02/14/2018 11:28  PMFS History: There are no active problems to display for this patient.  Past Medical History:  Diagnosis Date  . Hypertension   . Mitral prolapse     Family History  Problem Relation Age of Onset  . Dementia Father     History reviewed. No pertinent surgical history. Social History   Occupational History  . Not  on file  Tobacco Use  . Smoking status: Former Games developermoker  . Smokeless tobacco: Never Used  Substance and Sexual Activity  . Alcohol use: Yes    Frequency: Never    Comment: seldom  . Drug use: Never  . Sexual activity: Not on file

## 2018-05-15 ENCOUNTER — Encounter: Payer: Self-pay | Admitting: Emergency Medicine

## 2018-05-15 ENCOUNTER — Ambulatory Visit
Admission: EM | Admit: 2018-05-15 | Discharge: 2018-05-15 | Disposition: A | Discharge status: Home / Self Care | Payer: BLUE CROSS/BLUE SHIELD | Attending: Family Medicine | Admitting: Family Medicine

## 2018-05-15 DIAGNOSIS — J0110 Acute frontal sinusitis, unspecified: Secondary | ICD-10-CM | POA: Diagnosis not present

## 2018-05-15 MED ORDER — AMOXICILLIN-POT CLAVULANATE 875-125 MG PO TABS: 1.0000 | ORAL_TABLET | Freq: Two times a day (BID) | ORAL | 0 refills | Status: DC

## 2018-05-15 MED ORDER — FLUTICASONE PROPIONATE 50 MCG/ACT NA SUSP: 2.0000 | Freq: Every day | NASAL | 2 refills | Status: AC

## 2018-05-15 NOTE — ED Notes (Signed)
Patient able to ambulate independently  

## 2018-05-15 NOTE — ED Provider Notes (Signed)
[facility]    CSN: [id number] Arrival date & time: 05/15/18  1605     History   Chief Complaint Chief Complaint  Patient presents with  . URI    HPI Ellen Nelson is a 62 y.o. female.   HPI  Patient is here for 1 week illness.  She states she thinks is from her sinuses.  She has sinus congestion and postnasal drip.  Coughing.  Coughing is producing yellow and brown mucus.  She has laryngitis.  Chest congestion.  Has a feeling of shortness of breath with deep breath.  No chest pain.  Fatigue.  No rash.  No known exposure to influenza.  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  Past Medical History:  Diagnosis Date  . Hypertension   . Mitral prolapse     There are no active problems to display for this patient.   History reviewed. No pertinent surgical history.  OB History   No obstetric history on file.      Home Medications    Prior to Admission medications   Medication Sig Start Date End Date Taking? Authorizing Provider  cholecalciferol (VITAMIN D) 1000 units tablet Take 2,000 Units by mouth daily.    Yes [provider]  fexofenadine (ALLEGRA) 60 MG tablet Take 60 mg by mouth daily.    Yes [provider]  losartan (COZAAR) 100 MG tablet Take 100 mg by mouth daily.   Yes [provider]  Multiple Vitamins-Minerals (HAIR SKIN AND NAILS FORMULA PO) Take 1 tablet by mouth daily.   Yes [provider]  polycarbophil (FIBERCON) 625 MG tablet Take 1,250 mg by mouth 2 (two) times daily.   Yes [provider]  vitamin B-12 (CYANOCOBALAMIN) 100 MCG tablet Take 100 mcg by mouth daily.   Yes [provider]  vitamin C (ASCORBIC ACID) 500 MG tablet Take 500 mg by mouth daily.   Yes [provider]  amoxicillin-clavulanate (AUGMENTIN) 875-125 MG tablet Take 1 tablet by mouth every 12 (twelve) hours. 05/15/18   Eustace MooreNelson, Yvonne Sue, MD  fluticasone [facility](FLONASE) 50 MCG/ACT nasal spray Place 2 sprays into both  nostrils daily. 05/15/18   Eustace MooreNelson, Yvonne Sue, MD    Family History Family History  Problem Relation Age of Onset  . Dementia Father     Social History Social History   Tobacco Use  . Smoking status: Former Games developermoker  . Smokeless tobacco: Never Used  Substance Use Topics  . Alcohol use: Yes    Frequency: Never    Comment: seldom  . Drug use: Never     Allergies   Sulfa antibiotics   Review of Systems Review of Systems  Constitutional: Positive for fatigue. Negative for chills and fever.  HENT: Positive for dental problem, postnasal drip and rhinorrhea. Negative for ear pain and sore throat.   Eyes: Negative for pain and visual disturbance.  Respiratory: Positive for cough. Negative for shortness of breath.   Cardiovascular: Negative for chest pain and palpitations.  Gastrointestinal: Negative for abdominal pain and vomiting.  Genitourinary: Negative for dysuria and hematuria.  Musculoskeletal: Negative for arthralgias and back pain.  Skin: Negative for color change and rash.  Neurological: Negative for seizures and syncope.  All other systems reviewed and are negative.    Physical Exam Triage Vital Signs ED Triage Vitals  Enc Vitals Group     BP 05/15/18 1700 (!) 159/85     Pulse Rate 05/15/18 1700 63     Resp 05/15/18 1700  18     Temp 05/15/18 1700 (!) 97.5 F (36.4 C)     Temp Source 05/15/18 1700 Oral     SpO2 05/15/18 1700 98 %     Weight --      Height --      Head Circumference --      Peak Flow --      Pain Score 05/15/18 1706 5     Pain Loc --      Pain Edu? --      Excl. in GC? --    No data found.  Updated Vital Signs BP (!) 150/90 (BP Location: Left Arm)   Pulse 63   Temp (!) 97.5 F (36.4 C) (Oral)   Resp 18   SpO2 98%      Physical Exam Constitutional:      General: She is not in acute distress.    Appearance: She is well-developed. She is ill-appearing.  HENT:     Head: Normocephalic and atraumatic.     Right Ear: Tympanic  membrane, ear canal and external ear normal.     Left Ear: Tympanic membrane, ear canal and external ear normal.     Nose: Congestion and rhinorrhea present.     Mouth/Throat:     Mouth: Mucous membranes are moist.  Eyes:     Conjunctiva/sclera: Conjunctivae normal.     Pupils: Pupils are equal, round, and reactive to light.  Neck:     Musculoskeletal: Normal range of motion.  Cardiovascular:     Rate and Rhythm: Normal rate and regular rhythm.     Heart sounds: Normal heart sounds.  Pulmonary:     Effort: Pulmonary effort is normal. No respiratory distress.     Breath sounds: Normal breath sounds.  Abdominal:     General: There is no distension.     Palpations: Abdomen is soft.  Musculoskeletal: Normal range of motion.  Lymphadenopathy:     Cervical: Cervical adenopathy present.  Skin:    General: Skin is warm and dry.  Neurological:     General: No focal deficit present.     Mental Status: She is alert. Mental status is at baseline.  Psychiatric:        Mood and Affect: Mood normal.        Thought Content: Thought content normal.      UC Treatments / Results  Labs (all labs ordered are listed, but only abnormal results are displayed) Labs Reviewed - No data to display  EKG None  Radiology No results found.  Procedures Procedures (including critical care time)  Medications Ordered in UC Medications - No data to display  Initial Impression / Assessment and Plan / UC Course  I have reviewed the triage vital signs and the nursing notes.  Pertinent labs & imaging results that were available during my care of the patient were reviewed by me and considered in my medical decision making (see chart for details).    Likely started as a virus but after a week is still symptomatic and getting worse.  Will cover with antibiotics for brown sinus drainage Final Clinical Impressions(s) / UC Diagnoses   Final diagnoses:  Acute non-recurrent frontal sinusitis      Discharge Instructions     Get plenty of rest Push fluids Take antibiotic 2 times a day Use Flonase once a day as directed Consider getting a probiotic to take while on the antibiotic.  Ask your pharmacist for advice See your physician if not  better in the next few days   ED Prescriptions    Medication Sig Dispense Auth. Provider   amoxicillin-clavulanate (AUGMENTIN) 875-125 MG tablet Take 1 tablet by mouth every 12 (twelve) hours. 14 tablet Eustace Moore, MD   fluticasone [facility]) 50 MCG/ACT nasal spray Place 2 sprays into both nostrils daily. 16 g Eustace Moore, MD     Controlled Substance Prescriptions [place] Controlled Substance Registry consulted? Not Applicable   Eustace Moore, MD 05/15/18 [phone]

## 2018-05-15 NOTE — ED Triage Notes (Signed)
[facility] productive cough with yellow/brown sputum, hoarseness, and sinus pressure.

## 2018-05-15 NOTE — Discharge Instructions (Signed)
Get plenty of rest Push fluids Take antibiotic 2 times a day Use Flonase once a day as directed Consider getting a probiotic to take while on the antibiotic.  Ask your pharmacist for advice See your physician if not better in the next few days

## 2018-05-27 ENCOUNTER — Ambulatory Visit
Admission: EM | Admit: 2018-05-27 | Discharge: 2018-05-27 | Disposition: A | Discharge status: Home / Self Care | Payer: BLUE CROSS/BLUE SHIELD | Attending: Family Medicine | Admitting: Family Medicine

## 2018-05-27 ENCOUNTER — Ambulatory Visit: Payer: BLUE CROSS/BLUE SHIELD

## 2018-05-27 ENCOUNTER — Encounter: Payer: Self-pay | Admitting: Emergency Medicine

## 2018-05-27 ENCOUNTER — Other Ambulatory Visit: Payer: Self-pay

## 2018-05-27 DIAGNOSIS — S63502A Unspecified sprain of left wrist, initial encounter: Secondary | ICD-10-CM | POA: Insufficient documentation

## 2018-05-27 NOTE — ED Triage Notes (Signed)
Per pt she was at work and pushed a door and her left wrist was pushed back and now is slightly swollen and painful to move.

## 2018-05-27 NOTE — ED Provider Notes (Signed)
[facility]    CSN: [id number] Arrival date & time: 05/27/18  1142     History   Chief Complaint Chief Complaint  Patient presents with  . Wrist Injury    HPI Ellen Nelson is a 63 y.o. female.   HPI  Patient states it is worse the end of her shift.  She was in a hurry.  She pushed hard on a door that was somewhat stuck.  She felt a pop and pain in her left wrist.  Her wrist "bent back".  Today it swollen and painful.  She would like to have this evaluated.  No prior problems with her wrist, arthritis, or wrist pain.  Past Medical History:  Diagnosis Date  . Hypertension   . Mitral prolapse     There are no active problems to display for this patient.   History reviewed. No pertinent surgical history.  OB History   No obstetric history on file.      Home Medications    Prior to Admission medications   Medication Sig Start Date End Date Taking? Authorizing Provider  amoxicillin-clavulanate (AUGMENTIN) 875-125 MG tablet Take 1 tablet by mouth every 12 (twelve) hours. 05/15/18   Eustace Moore, MD  cholecalciferol (VITAMIN D) 1000 units tablet Take 2,000 Units by mouth daily.     [provider]  fexofenadine (ALLEGRA) 60 MG tablet Take 60 mg by mouth daily.     [provider]  fluticasone (FLONASE) 50 MCG/ACT nasal spray Place 2 sprays into both nostrils daily. 05/15/18   Eustace Moore, MD  losartan (COZAAR) 100 MG tablet Take 100 mg by mouth daily.    [provider]  Multiple Vitamins-Minerals (HAIR SKIN AND NAILS FORMULA PO) Take 1 tablet by mouth daily.    [provider]  polycarbophil (FIBERCON) 625 MG tablet Take 1,250 mg by mouth 2 (two) times daily.    [provider]  vitamin B-12 (CYANOCOBALAMIN) 100 MCG tablet Take 100 mcg by mouth daily.    [provider]  vitamin C (ASCORBIC ACID) 500 MG tablet Take 500 mg by mouth daily.    [provider]    Family  History Family History  Problem Relation Age of Onset  . Dementia Father     Social History Social History   Tobacco Use  . Smoking status: Former Games developer  . Smokeless tobacco: Never Used  Substance Use Topics  . Alcohol use: Yes    Frequency: Never    Comment: seldom  . Drug use: Never     Allergies   Sulfa antibiotics   Review of Systems Review of Systems  Constitutional: Negative for chills and fever.  HENT: Negative for ear pain and sore throat.   Eyes: Negative for pain and visual disturbance.  Respiratory: Negative for cough and shortness of breath.   Cardiovascular: Negative for chest pain and palpitations.  Gastrointestinal: Negative for abdominal pain and vomiting.  Genitourinary: Negative for dysuria and hematuria.  Musculoskeletal: Positive for arthralgias. Negative for back pain.  Skin: Negative for color change and rash.  Neurological: Negative for seizures and syncope.  All other systems reviewed and are negative.    Physical Exam Triage Vital Signs ED Triage Vitals  Enc Vitals Group     BP 05/27/18 1201 (!) 135/92     Pulse Rate 05/27/18 1201 83     Resp --      Temp 05/27/18 1201 98.5 F (36.9 C)     Temp  Source 05/27/18 1201 Oral     SpO2 05/27/18 1201 96 %     Weight 05/27/18 1202 260 lb (117.9 kg)     Height 05/27/18 1202 5\' 7"  (1.702 m)     Head Circumference --      Peak Flow --      Pain Score 05/27/18 1202 7     Pain Loc --      Pain Edu? --      Excl. in GC? --    No data found.  Updated Vital Signs BP (!) 135/92 (BP Location: Right Arm)   Pulse 83   Temp 98.5 F (36.9 C) (Oral)   Ht 5\' 7"  (1.702 m)   Wt 117.9 kg   SpO2 96%   BMI 40.72 kg/m       Physical Exam Constitutional:      General: She is not in acute distress.    Appearance: She is well-developed.  HENT:     Head: Normocephalic and atraumatic.  Eyes:     Conjunctiva/sclera: Conjunctivae normal.     Pupils: Pupils are equal, round, and reactive to light.   Neck:     Musculoskeletal: Normal range of motion.  Cardiovascular:     Rate and Rhythm: Normal rate.  Pulmonary:     Effort: Pulmonary effort is normal. No respiratory distress.  Abdominal:     General: There is no distension.     Palpations: Abdomen is soft.  Musculoskeletal: Normal range of motion.     Comments: Left wrist has slight soft tissue swelling over the radial aspect.  Tenderness is acute over the scaphoid.  Range of motion is good.  Distal neurovascular is normal  Skin:    General: Skin is warm and dry.  Neurological:     Mental Status: She is alert.      UC Treatments / Results  Labs (all labs ordered are listed, but only abnormal results are displayed) Labs Reviewed - No data to display  EKG None  Radiology Dg Wrist Complete Left  Result Date: 05/27/2018 CLINICAL DATA:  Left wrist pain.  Pushed heavy door yesterday. EXAM: LEFT WRIST - COMPLETE 3+ VIEW COMPARISON:  None. FINDINGS: No acute fracture. No dislocation.  Unremarkable soft tissues. IMPRESSION: No acute bony pathology. Electronically Signed   By: Jolaine Click M.D.   On: 05/27/2018 13:08    Procedures Procedures (including critical care time)  Medications Ordered in UC Medications - No data to display  Initial Impression / Assessment and Plan / UC Course  I have reviewed the triage vital signs and the nursing notes.  Pertinent labs & imaging results that were available during my care of the patient were reviewed by me and considered in my medical decision making (see chart for details).     No fracture/ discussed sprain Final Clinical Impressions(s) / UC Diagnoses   Final diagnoses:  Sprain of left wrist, initial encounter     Discharge Instructions     Wear brace for comfort Nothing broken Take ibuprofen for pain and swelling See your doctor in follow up    ED Prescriptions    None     Controlled Substance Prescriptions [place] Controlled Substance Registry consulted? Not  Applicable   Eustace Moore, MD 05/27/18 [phone]

## 2018-05-27 NOTE — Discharge Instructions (Addendum)
Wear brace for comfort Nothing broken Take ibuprofen for pain and swelling See your doctor in follow up

## 2018-12-26 ENCOUNTER — Ambulatory Visit: Payer: BLUE CROSS/BLUE SHIELD | Admitting: Podiatry

## 2019-01-12 ENCOUNTER — Ambulatory Visit (INDEPENDENT_AMBULATORY_CARE_PROVIDER_SITE_OTHER): Payer: BLUE CROSS/BLUE SHIELD | Admitting: Orthopaedic Surgery

## 2019-01-12 DIAGNOSIS — M25511 Pain in right shoulder: Secondary | ICD-10-CM

## 2019-01-12 DIAGNOSIS — G8929 Other chronic pain: Secondary | ICD-10-CM

## 2019-01-12 DIAGNOSIS — M7541 Impingement syndrome of right shoulder: Secondary | ICD-10-CM

## 2019-01-12 DIAGNOSIS — M75101 Unspecified rotator cuff tear or rupture of right shoulder, not specified as traumatic: Secondary | ICD-10-CM

## 2019-01-12 MED ORDER — LIDOCAINE HCL 1 % IJ SOLN
3.0000 mL | INTRAMUSCULAR | Status: AC | PRN
  Administered 2019-01-12: 10:00:00 3 mL

## 2019-01-12 MED ORDER — BUPIVACAINE HCL 0.5 % IJ SOLN
3.0000 mL | INTRAMUSCULAR | Status: AC | PRN
  Administered 2019-01-12: 10:00:00 3 mL via INTRA_ARTICULAR

## 2019-01-12 MED ORDER — METHYLPREDNISOLONE ACETATE 40 MG/ML IJ SUSP
40.0000 mg | INTRAMUSCULAR | Status: AC | PRN
  Administered 2019-01-12: 40 mg via INTRA_ARTICULAR

## 2019-01-12 NOTE — Progress Notes (Signed)
Office Visit Note   Patient: Ellen Nelson           Date of Birth: 05/11/1956           MRN: [id number] Visit Date: 01/12/2019              Requested by: Rinaldo CloudHarwani, Mohan, MD [phone] [address] [place] [id number] PCP: Rinaldo CloudHarwani, Mohan, MD   Assessment & Plan: Visit Diagnoses:  1. Impingement syndrome of right shoulder   2. Tear of right supraspinatus tendon   3. Chronic right shoulder pain     Plan: Again we had a discussion about the MRI findings and how the cortisone injection is only temporary and masking the symptoms.  Her function is preserved enough for her to do her job at least which is fortunate.  She does understand that there is noted a small chance that the rotator cuff tear will propagate and enlarge and retract which may make it irreparable in the future.  She does not have time for physical therapy as she is very busy managing the [employer].  Subacromial injection was repeated today.  Patient tolerated this well.  I hope that she get some good relief from this injection.  We will see her back as needed.  Follow-Up Instructions: Return if symptoms worsen or fail to improve.   Orders:  No orders of the defined types were placed in this encounter.  No orders of the defined types were placed in this encounter.     Procedures: Large Joint Inj: R subacromial bursa on 01/12/2019 10:24 AM Indications: pain Details: 22 G needle  Arthrogram: No  Medications: 3 mL lidocaine 1 %; 3 mL bupivacaine 0.5 %; 40 mg methylPREDNISolone acetate 40 MG/ML Outcome: tolerated well, no immediate complications Consent was given by the patient. Patient was prepped and draped in the usual sterile fashion.       Clinical Data: No additional findings.   Subjective: Chief Complaint  Patient presents with  . Right Shoulder - Pain    Ellen Nelson returns today for follow-up of right shoulder pain.  We saw her almost a year ago for this and an MRI back in  September 2019 showed full-thickness tear of the supraspinatus without retraction.  She does have degenerative changes throughout her shoulder as well.  She states that she averages a 5/10 pain every day.  Denies any radicular symptoms.  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   Review of Systems  Constitutional: Negative.   HENT: Negative.   Eyes: Negative.   Respiratory: Negative.   Cardiovascular: Negative.   Endocrine: Negative.   Musculoskeletal: Negative.   Neurological: Negative.   Hematological: Negative.   Psychiatric/Behavioral: Negative.   All other systems reviewed and are negative.    Objective: Vital Signs: There were no vitals taken for this visit.  Physical Exam Vitals signs and nursing note reviewed.  Constitutional:      Appearance: She is well-developed.  Pulmonary:     Effort: Pulmonary effort is normal.  Skin:    General: Skin is warm.     Capillary Refill: Capillary refill takes less than 2 seconds.  Neurological:     Mental Status: She is alert and oriented to person, place, and time.  Psychiatric:        Behavior: Behavior normal.        Thought Content: Thought content normal.  Judgment: Judgment normal.     Ortho Exam Positive empty can.  Active range of motion and passive range of motion are normal but there is definite pain with active elevation of the arm above the shoulder. Specialty Comments:  No specialty comments available.  Imaging: No results found.   PMFS History: There are no active problems to display for this patient.  Past Medical History:  Diagnosis Date  . Hypertension   . Mitral prolapse     Family History  Problem Relation Age of Onset  . Dementia Father     No past surgical history on file. Social History   Occupational History  . Not on file  Tobacco Use  . Smoking status: Former Research scientist (life sciences)  . Smokeless tobacco: Never Used  Substance and Sexual  Activity  . Alcohol use: Yes    Frequency: Never    Comment: seldom  . Drug use: Never  . Sexual activity: Not on file

## 2019-03-04 ENCOUNTER — Ambulatory Visit (HOSPITAL_COMMUNITY)
Admission: EM | Admit: 2019-03-04 | Discharge: 2019-03-04 | Disposition: A | Discharge status: Home / Self Care | Payer: BLUE CROSS/BLUE SHIELD | Attending: Family Medicine | Admitting: Family Medicine

## 2019-03-04 ENCOUNTER — Encounter (HOSPITAL_COMMUNITY): Payer: Self-pay

## 2019-03-04 ENCOUNTER — Other Ambulatory Visit: Payer: Self-pay

## 2019-03-04 DIAGNOSIS — W57XXXA Bitten or stung by nonvenomous insect and other nonvenomous arthropods, initial encounter: Secondary | ICD-10-CM | POA: Diagnosis not present

## 2019-03-04 DIAGNOSIS — S60460A Insect bite (nonvenomous) of right index finger, initial encounter: Secondary | ICD-10-CM | POA: Diagnosis not present

## 2019-03-04 NOTE — ED Triage Notes (Signed)
Pt present insect bite to her fingers on both hands. Pt grab a paper towel to kill a spider and she think she was bitten. Pt has some small black dots on some certain finger. Pt fingers are tender to the touch.

## 2019-03-04 NOTE — ED Provider Notes (Signed)
[place]    CSN: [id number] Arrival date & time: 03/04/19  1441      History   Chief Complaint Chief Complaint  Patient presents with  . Insect Bite    HPI Ellen Nelson is a 63 y.o. female.   HPI  Patient states that she is found a spider in her laundry room yesterday.  She states that she grabbed with a paper towel when squished between her thumb and index fingers with both of her hands to ensure that it was dead.  This morning she woke up in the thumb and index fingers on each hand are sore in the pad and slightly red.  She thinks it may be from the spider.  Cannot think of any other reason that these fingertips would be sore.  States it definitely was not a brown recluse and definitely was not a black widow spider.  She is never had a bite reaction before.  Past Medical History:  Diagnosis Date  . Hypertension   . Mitral prolapse     There are no active problems to display for this patient.   History reviewed. No pertinent surgical history.  OB History   No obstetric history on file.      Home Medications    Prior to Admission medications   Medication Sig Start Date End Date Taking? Authorizing Provider  cholecalciferol (VITAMIN D) 1000 units tablet Take 2,000 Units by mouth daily.     [provider]  fexofenadine (ALLEGRA) 60 MG tablet Take 60 mg by mouth daily.     [provider]  fluticasone (FLONASE) 50 MCG/ACT nasal spray Place 2 sprays into both nostrils daily. 05/15/18   Raylene Everts, MD  losartan (COZAAR) 100 MG tablet Take 100 mg by mouth daily.    [provider]  Multiple Vitamins-Minerals (HAIR SKIN AND NAILS FORMULA PO) Take 1 tablet by mouth daily.    [provider]  polycarbophil (FIBERCON) 625 MG tablet Take 1,250 mg by mouth 2 (two) times daily.    [provider]  vitamin B-12 (CYANOCOBALAMIN) 100 MCG tablet Take 100 mcg by mouth daily.    [provider]  vitamin C  (ASCORBIC ACID) 500 MG tablet Take 500 mg by mouth daily.    [provider]    Family History Family History  Problem Relation Age of Onset  . Dementia Father     Social History Social History   Tobacco Use  . Smoking status: Former Research scientist (life sciences)  . Smokeless tobacco: Never Used  Substance Use Topics  . Alcohol use: Yes    Frequency: Never    Comment: seldom  . Drug use: Never     Allergies   Sulfa antibiotics   Review of Systems Review of Systems  Constitutional: Negative for chills and fever.  HENT: Negative for ear pain and sore throat.   Eyes: Negative for pain and visual disturbance.  Respiratory: Negative for cough and shortness of breath.   Cardiovascular: Negative for chest pain and palpitations.  Gastrointestinal: Negative for abdominal pain and vomiting.  Genitourinary: Negative for dysuria and hematuria.  Musculoskeletal: Negative for arthralgias and back pain.  Skin: Positive for color change. Negative for rash.  Neurological: Negative for seizures and syncope.  All other systems reviewed and are negative.    Physical Exam Triage Vital Signs ED Triage Vitals  Enc Vitals Group     BP 03/04/19 1459 131/81     Pulse Rate 03/04/19 1459 68  Resp 03/04/19 1459 17     Temp 03/04/19 1459 98.3 F (36.8 C)     Temp Source 03/04/19 1459 Oral     SpO2 03/04/19 1459 97 %     Weight --      Height --      Head Circumference --      Peak Flow --      Pain Score 03/04/19 1502 0     Pain Loc --      Pain Edu? --      Excl. in GC? --    No data found.  Updated Vital Signs BP 131/81 (BP Location: Right Arm)   Pulse 68   Temp 98.3 F (36.8 C) (Oral)   Resp 17   SpO2 97%   Visual Acuity Right Eye Distance:   Left Eye Distance:   Bilateral Distance:    Right Eye Near:   Left Eye Near:    Bilateral Near:     Physical Exam Constitutional:      General: She is not in acute distress.    Appearance: She is well-developed.  HENT:     Head:  Normocephalic and atraumatic.  Eyes:     Conjunctiva/sclera: Conjunctivae normal.     Pupils: Pupils are equal, round, and reactive to light.  Neck:     Musculoskeletal: Normal range of motion.  Cardiovascular:     Rate and Rhythm: Normal rate and regular rhythm.     Heart sounds: Normal heart sounds.  Pulmonary:     Effort: Pulmonary effort is normal. No respiratory distress.     Breath sounds: Normal breath sounds.  Abdominal:     General: There is no distension.     Palpations: Abdomen is soft.  Musculoskeletal: Normal range of motion.  Skin:    General: Skin is warm and dry.     Comments: The pads of the index finger of the right hand and thumb, left index and thumb are slightly erythematous and tender.  No swelling.  There is a punctate purple mark on the lateral aspect of the index finger of the right hand that could be a bite.  Difficult to tell.  No adenopathy.  No cellulitis.  Minimally tender to touch.  Neurological:     Mental Status: She is alert.  Psychiatric:        Mood and Affect: Mood normal.        Behavior: Behavior normal.      UC Treatments / Results  Labs (all labs ordered are listed, but only abnormal results are displayed) Labs Reviewed - No data to display  EKG   Radiology No results found.  Procedures Procedures (including critical care time)  Medications Ordered in UC Medications - No data to display  Initial Impression / Assessment and Plan / UC Course  I have reviewed the triage vital signs and the nursing notes.  Pertinent labs & imaging results that were available during my care of the patient were reviewed by me and considered in my medical decision making (see chart for details).     Uncertain, but likely from scratching the spider.  Do not know what kind of reaction this is with that seen on the recent I can think of that the thumb and index finger of each hand would be sore with redness of the pad.  I do not expect any serious  reaction.  Lungs are clear.Expect improvement over time with Benadryl.  Offered cortisone injection, but patient declines Final  Clinical Impressions(s) / UC Diagnoses   Final diagnoses:  Insect bite of right index finger, initial encounter     Discharge Instructions     Take Benadryl 25 mg every 4-6 hours as needed for the insect bite Call or return for any worsening of symptoms I expect the soreness to go away over a couple days   ED Prescriptions    None     PDMP not reviewed this encounter.   Eustace MooreNelson, Yvonne Sue, MD 03/04/19 [phone]

## 2019-03-04 NOTE — Discharge Instructions (Signed)
Take Benadryl 25 mg every 4-6 hours as needed for the insect bite Call or return for any worsening of symptoms I expect the soreness to go away over a couple days

## 2019-03-16 ENCOUNTER — Other Ambulatory Visit: Payer: Self-pay

## 2019-03-16 ENCOUNTER — Ambulatory Visit: Payer: BLUE CROSS/BLUE SHIELD | Admitting: Podiatry

## 2019-04-05 ENCOUNTER — Ambulatory Visit: Payer: BLUE CROSS/BLUE SHIELD | Admitting: Obstetrics and Gynecology

## 2019-05-07 ENCOUNTER — Ambulatory Visit: Payer: BLUE CROSS/BLUE SHIELD | Admitting: Obstetrics and Gynecology

## 2019-05-28 ENCOUNTER — Ambulatory Visit: Payer: BC Managed Care – PPO | Admitting: Obstetrics and Gynecology

## 2019-09-05 ENCOUNTER — Encounter: Payer: Self-pay | Admitting: Orthopaedic Surgery

## 2019-09-05 ENCOUNTER — Ambulatory Visit: Payer: BLUE CROSS/BLUE SHIELD | Admitting: Orthopaedic Surgery

## 2019-09-05 ENCOUNTER — Other Ambulatory Visit: Payer: Self-pay

## 2019-09-05 ENCOUNTER — Ambulatory Visit: Payer: Self-pay

## 2019-09-05 VITALS — Ht 67.0 in | Wt 221.0 lb

## 2019-09-05 DIAGNOSIS — M7541 Impingement syndrome of right shoulder: Secondary | ICD-10-CM | POA: Diagnosis not present

## 2019-09-05 DIAGNOSIS — M75121 Complete rotator cuff tear or rupture of right shoulder, not specified as traumatic: Secondary | ICD-10-CM | POA: Insufficient documentation

## 2019-09-05 DIAGNOSIS — M79641 Pain in right hand: Secondary | ICD-10-CM

## 2019-09-05 DIAGNOSIS — M79642 Pain in left hand: Secondary | ICD-10-CM
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
## 2019-09-13 ENCOUNTER — Other Ambulatory Visit: Payer: Self-pay

## 2019-09-13 DIAGNOSIS — M79641 Pain in right hand: Secondary | ICD-10-CM

## 2019-09-13 DIAGNOSIS — M79642 Pain in left hand: Secondary | ICD-10-CM

## 2019-09-13 DIAGNOSIS — M75121 Complete rotator cuff tear or rupture of right shoulder, not specified as traumatic: Secondary | ICD-10-CM

## 2019-10-12 ENCOUNTER — Encounter: Payer: Self-pay | Admitting: Physical Medicine and Rehabilitation

## 2019-10-12 ENCOUNTER — Other Ambulatory Visit: Payer: Self-pay

## 2019-10-12 ENCOUNTER — Ambulatory Visit (INDEPENDENT_AMBULATORY_CARE_PROVIDER_SITE_OTHER): Payer: BLUE CROSS/BLUE SHIELD | Admitting: Physical Medicine and Rehabilitation

## 2019-10-12 DIAGNOSIS — R202 Paresthesia of skin: Secondary | ICD-10-CM | POA: Diagnosis not present

## 2019-10-12 NOTE — Progress Notes (Signed)
Numbness in second through fifth fingers on both hands. Worse when she wakes up. Swelling in left hand. Right hand dominant.  Numeric Pain Rating Scale and Functional Assessment Average Pain 8   In the last MONTH (on 0-10 scale) has pain interfered with the following?  1. General activity like being  able to carry out your everyday physical activities such as walking, climbing stairs, carrying groceries, or moving a chair?  Rating(5)

## 2019-10-15 NOTE — Procedures (Signed)
EMG & NCV Findings: Evaluation of the left median motor nerve showed prolonged distal onset latency (4.9 ms).  The right median motor nerve showed prolonged distal onset latency (4.4 ms) and decreased conduction velocity (Elbow-Wrist, 49 m/s).  The left median (across palm) sensory nerve showed no response (Palm), prolonged distal peak latency (6.3 ms), and reduced amplitude (9.8 V).  The right median (across palm) sensory nerve showed no response (Palm) and prolonged distal peak latency (4.8 ms).  All remaining nerves (as indicated in the following tables) were within normal limits.  All left vs. right side differences were within normal limits.    All examined muscles (as indicated in the following table) showed no evidence of electrical instability.    Impression: The above electrodiagnostic study is ABNORMAL and reveals evidence of a moderate BILATERAL median nerve entrapment at the wrist (carpal tunnel syndrome) affecting sensory and motor components.   There is no significant electrodiagnostic evidence of any other focal nerve entrapment, brachial plexopathy or generalized peripheral neuropathy.   Recommendations: 1.  Follow-up with referring physician. 2.  Continue current management of symptoms. 3.  Continue use of resting splint at night-time and as needed during the day. 4.  Suggest surgical evaluation.  ___________________________ Laurence Spates FAAPMR Board Certified, American Board of Physical Medicine and Rehabilitation    Nerve Conduction Studies Anti Sensory Summary Table   Stim Site NR Peak (ms) Norm Peak (ms) P-T Amp (V) Norm P-T Amp Site1 Site2 Delta-P (ms) Dist (cm) Vel (m/s) Norm Vel (m/s)  Left Median Acr Palm Anti Sensory (2nd Digit)  30.5C  Wrist    *6.3 <3.6 *9.8 >10 Wrist Palm  0.0    Palm *NR  <2.0          Right Median Acr Palm Anti Sensory (2nd Digit)  29.9C  Wrist    *4.8 <3.6 13.6 >10 Wrist Palm  0.0    Palm *NR  <2.0          Left Radial Anti Sensory  (Base 1st Digit)  29.9C  Wrist    2.3 <3.1 42.1  Wrist Base 1st Digit 2.3 0.0    Right Radial Anti Sensory (Base 1st Digit)  29.9C  Wrist    2.4 <3.1 16.2  Wrist Base 1st Digit 2.4 0.0    Left Ulnar Anti Sensory (5th Digit)  30.8C  Wrist    3.6 <3.7 16.1 >15.0 Wrist 5th Digit 3.6 14.0 39 >38  Right Ulnar Anti Sensory (5th Digit)  30.2C  Wrist    3.4 <3.7 20.9 >15.0 Wrist 5th Digit 3.4 14.0 41 >38   Motor Summary Table   Stim Site NR Onset (ms) Norm Onset (ms) O-P Amp (mV) Norm O-P Amp Site1 Site2 Delta-0 (ms) Dist (cm) Vel (m/s) Norm Vel (m/s)  Left Median Motor (Abd Poll Brev)  30.3C  Wrist    *4.9 <4.2 6.1 >5 Elbow Wrist 4.3 22.0 51 >50  Elbow    9.2  6.8         Right Median Motor (Abd Poll Brev)  30.1C  Wrist    *4.4 <4.2 9.5 >5 Elbow Wrist 4.7 23.0 *49 >50  Elbow    9.1  9.5         Left Ulnar Motor (Abd Dig Min)  30.7C  Wrist    3.2 <4.2 5.3 >3 B Elbow Wrist 3.7 21.0 57 >53  B Elbow    6.9  3.3  A Elbow B Elbow 2.0 11.0 55 >53  A Elbow  8.9  3.7         Right Ulnar Motor (Abd Dig Min)  30.3C  Wrist    3.0 <4.2 5.7 >3 B Elbow Wrist 3.3 21.5 65 >53  B Elbow    6.3  4.9  A Elbow B Elbow 1.4 10.0 71 >53  A Elbow    7.7  4.9          EMG   Side Muscle Nerve Root Ins Act Fibs Psw Amp Dur Poly Recrt Int Dennie Bible Comment  Left Abd Poll Brev Median C8-T1 Nml Nml Nml Nml Nml 0 Nml Nml   Left 1stDorInt Ulnar C8-T1 Nml Nml Nml Nml Nml 0 Nml Nml   Left PronatorTeres Median C6-7 Nml Nml Nml Nml Nml 0 Nml Nml   Left Biceps Musculocut C5-6 Nml Nml Nml Nml Nml 0 Nml Nml   Left Deltoid Axillary C5-6 Nml Nml Nml Nml Nml 0 Nml Nml     Nerve Conduction Studies Anti Sensory Left/Right Comparison   Stim Site L Lat (ms) R Lat (ms) L-R Lat (ms) L Amp (V) R Amp (V) L-R Amp (%) Site1 Site2 L Vel (m/s) R Vel (m/s) L-R Vel (m/s)  Median Acr Palm Anti Sensory (2nd Digit)  30.5C  Wrist *6.3 *4.8 1.5 *9.8 13.6 27.9 Wrist Palm     Palm             Radial Anti Sensory (Base 1st Digit)  29.9C   Wrist 2.3 2.4 0.1 42.1 16.2 61.5 Wrist Base 1st Digit     Ulnar Anti Sensory (5th Digit)  30.8C  Wrist 3.6 3.4 0.2 16.1 20.9 23.0 Wrist 5th Digit 39 41 2   Motor Left/Right Comparison   Stim Site L Lat (ms) R Lat (ms) L-R Lat (ms) L Amp (mV) R Amp (mV) L-R Amp (%) Site1 Site2 L Vel (m/s) R Vel (m/s) L-R Vel (m/s)  Median Motor (Abd Poll Brev)  30.3C  Wrist *4.9 *4.4 0.5 6.1 9.5 35.8 Elbow Wrist 51 *49 2  Elbow 9.2 9.1 0.1 6.8 9.5 28.4       Ulnar Motor (Abd Dig Min)  30.7C  Wrist 3.2 3.0 0.2 5.3 5.7 7.0 B Elbow Wrist 57 65 8  B Elbow 6.9 6.3 0.6 3.3 4.9 32.7 A Elbow B Elbow 55 71 16  A Elbow 8.9 7.7 1.2 3.7 4.9 24.5          Waveforms:

## 2019-10-15 NOTE — Progress Notes (Signed)
Ellen Nelson - 64 y.o. female MRN [id number]  Date of birth: [birth year]  Office Visit Note: Visit Date: 10/12/2019 PCP: Rinaldo Cloud, MD Referred by: Rinaldo Cloud, MD  Subjective: Chief Complaint  Patient presents with  . Right Hand - Numbness  . Left Hand - Numbness   HPI:  Ellen Nelson is a 64 y.o. female who comes in today At the request of Dr. Nena Alexander for electrodiagnostic study of both upper limbs.  Patient is right-hand dominant with chronic history but several months of worsening severe 8 out of 10 pain numbness and tingling in both hands right a little bit more than left.  Symptoms occur in the second through fifth digits.  She does endorse fifth finger pain numbness and tingling on both hands.  She is worse when she wakes up in the morning and worse with activity.  She is having trouble gripping objects and manipulating small objects.  She has tried bracing and different medications without much relief.  She is also having significant right shoulder pain but has a history of complete rotator cuff tear.  She has not had prior electrodiagnostic study.  ROS Otherwise per HPI.  Assessment & Plan: Visit Diagnoses:  1. Paresthesia of skin     Plan: Impression: The above electrodiagnostic study is ABNORMAL and reveals evidence of a moderate BILATERAL median nerve entrapment at the wrist (carpal tunnel syndrome) affecting sensory and motor components.   There is no significant electrodiagnostic evidence of any other focal nerve entrapment, brachial plexopathy or generalized peripheral neuropathy.   Recommendations: 1.  Follow-up with referring physician. 2.  Continue current management of symptoms. 3.  Continue use of resting splint at night-time and as needed during the day. 4.  Suggest surgical evaluation.  Meds & Orders: No orders of the defined types were placed in this encounter.   Orders Placed This Encounter  Procedures  . NCV with EMG (electromyography)    Follow-up: Return for Glee Arvin, MD.   Procedures: No procedures performed  EMG & NCV Findings: Evaluation of the left median motor nerve showed prolonged distal onset latency (4.9 ms).  The right median motor nerve showed prolonged distal onset latency (4.4 ms) and decreased conduction velocity (Elbow-Wrist, 49 m/s).  The left median (across Nelson) sensory nerve showed no response (Nelson), prolonged distal peak latency (6.3 ms), and reduced amplitude (9.8 V).  The right median (across Nelson) sensory nerve showed no response (Nelson) and prolonged distal peak latency (4.8 ms).  All remaining nerves (as indicated in the following tables) were within normal limits.  All left vs. right side differences were within normal limits.    All examined muscles (as indicated in the following table) showed no evidence of electrical instability.    Impression: The above electrodiagnostic study is ABNORMAL and reveals evidence of a moderate BILATERAL median nerve entrapment at the wrist (carpal tunnel syndrome) affecting sensory and motor components.   There is no significant electrodiagnostic evidence of any other focal nerve entrapment, brachial plexopathy or generalized peripheral neuropathy.   Recommendations: 1.  Follow-up with referring physician. 2.  Continue current management of symptoms. 3.  Continue use of resting splint at night-time and as needed during the day. 4.  Suggest surgical evaluation.  ___________________________ Elease Hashimoto Board Certified, American Board of Physical Medicine and Rehabilitation    Nerve Conduction Studies Anti Sensory Summary Table   Stim Site NR Peak (ms) Norm Peak (ms) P-T Amp (V) Norm P-T Amp Site1 Site2  Delta-P (ms) Dist (cm) Vel (m/s) Norm Vel (m/s)  Left Median Acr Nelson Anti Sensory (2nd Digit)  30.5C  Wrist    *6.3 <3.6 *9.8 >10 Wrist Nelson  0.0    Nelson *NR  <2.0          Right Median Acr Nelson Anti Sensory (2nd Digit)  29.9C  Wrist    *4.8  <3.6 13.6 >10 Wrist Nelson  0.0    Nelson *NR  <2.0          Left Radial Anti Sensory (Base 1st Digit)  29.9C  Wrist    2.3 <3.1 42.1  Wrist Base 1st Digit 2.3 0.0    Right Radial Anti Sensory (Base 1st Digit)  29.9C  Wrist    2.4 <3.1 16.2  Wrist Base 1st Digit 2.4 0.0    Left Ulnar Anti Sensory (5th Digit)  30.8C  Wrist    3.6 <3.7 16.1 >15.0 Wrist 5th Digit 3.6 14.0 39 >38  Right Ulnar Anti Sensory (5th Digit)  30.2C  Wrist    3.4 <3.7 20.9 >15.0 Wrist 5th Digit 3.4 14.0 41 >38   Motor Summary Table   Stim Site NR Onset (ms) Norm Onset (ms) O-P Amp (mV) Norm O-P Amp Site1 Site2 Delta-0 (ms) Dist (cm) Vel (m/s) Norm Vel (m/s)  Left Median Motor (Abd Poll Brev)  30.3C  Wrist    *4.9 <4.2 6.1 >5 Elbow Wrist 4.3 22.0 51 >50  Elbow    9.2  6.8         Right Median Motor (Abd Poll Brev)  30.1C  Wrist    *4.4 <4.2 9.5 >5 Elbow Wrist 4.7 23.0 *49 >50  Elbow    9.1  9.5         Left Ulnar Motor (Abd Dig Min)  30.7C  Wrist    3.2 <4.2 5.3 >3 B Elbow Wrist 3.7 21.0 57 >53  B Elbow    6.9  3.3  A Elbow B Elbow 2.0 11.0 55 >53  A Elbow    8.9  3.7         Right Ulnar Motor (Abd Dig Min)  30.3C  Wrist    3.0 <4.2 5.7 >3 B Elbow Wrist 3.3 21.5 65 >53  B Elbow    6.3  4.9  A Elbow B Elbow 1.4 10.0 71 >53  A Elbow    7.7  4.9          EMG   Side Muscle Nerve Root Ins Act Fibs Psw Amp Dur Poly Recrt Int Dennie Bible Comment  Left Abd Poll Brev Median C8-T1 Nml Nml Nml Nml Nml 0 Nml Nml   Left 1stDorInt Ulnar C8-T1 Nml Nml Nml Nml Nml 0 Nml Nml   Left PronatorTeres Median C6-7 Nml Nml Nml Nml Nml 0 Nml Nml   Left Biceps Musculocut C5-6 Nml Nml Nml Nml Nml 0 Nml Nml   Left Deltoid Axillary C5-6 Nml Nml Nml Nml Nml 0 Nml Nml     Nerve Conduction Studies Anti Sensory Left/Right Comparison   Stim Site L Lat (ms) R Lat (ms) L-R Lat (ms) L Amp (V) R Amp (V) L-R Amp (%) Site1 Site2 L Vel (m/s) R Vel (m/s) L-R Vel (m/s)  Median Acr Nelson Anti Sensory (2nd Digit)  30.5C  Wrist *6.3 *4.8 1.5 *9.8 13.6  27.9 Wrist Nelson     Nelson             Radial Anti Sensory (Base 1st Digit)  29.9C  Wrist 2.3 2.4 0.1 42.1 16.2 61.5 Wrist Base 1st Digit     Ulnar Anti Sensory (5th Digit)  30.8C  Wrist 3.6 3.4 0.2 16.1 20.9 23.0 Wrist 5th Digit 39 41 2   Motor Left/Right Comparison   Stim Site L Lat (ms) R Lat (ms) L-R Lat (ms) L Amp (mV) R Amp (mV) L-R Amp (%) Site1 Site2 L Vel (m/s) R Vel (m/s) L-R Vel (m/s)  Median Motor (Abd Poll Brev)  30.3C  Wrist *4.9 *4.4 0.5 6.1 9.5 35.8 Elbow Wrist 51 *49 2  Elbow 9.2 9.1 0.1 6.8 9.5 28.4       Ulnar Motor (Abd Dig Min)  30.7C  Wrist 3.2 3.0 0.2 5.3 5.7 7.0 B Elbow Wrist 57 65 8  B Elbow 6.9 6.3 0.6 3.3 4.9 32.7 A Elbow B Elbow 55 71 16  A Elbow 8.9 7.7 1.2 3.7 4.9 24.5          Waveforms:                      Clinical History: No specialty comments available.     Objective:  VS:  HT:    WT:   BMI:     BP:   HR: bpm  TEMP: ( )  RESP:  Physical Exam Musculoskeletal:        General: No swelling, tenderness or deformity.     Comments: Inspection reveals no atrophy of the bilateral APB or FDI or hand intrinsics. There is no swelling, color changes, allodynia or dystrophic changes. There is 5 out of 5 strength in the bilateral wrist extension, finger abduction and long finger flexion. There is intact sensation to light touch in all dermatomal and peripheral nerve distributions.  There is a Positive Phalen's test bilaterally. There is a negative Hoffmann's test bilaterally.  Skin:    General: Skin is warm and dry.     Findings: No erythema or rash.  Neurological:     General: No focal deficit present.     Mental Status: She is alert and oriented to person, place, and time.     Motor: No weakness or abnormal muscle tone.     Coordination: Coordination normal.  Psychiatric:        Mood and Affect: Mood normal.        Behavior: Behavior normal.      Imaging: No results found.

## 2019-10-19 ENCOUNTER — Ambulatory Visit: Payer: BLUE CROSS/BLUE SHIELD | Admitting: Orthopaedic Surgery

## 2019-10-26 ENCOUNTER — Encounter: Payer: Self-pay | Admitting: Orthopaedic Surgery

## 2019-10-26 ENCOUNTER — Other Ambulatory Visit: Payer: Self-pay

## 2019-10-26 ENCOUNTER — Ambulatory Visit: Payer: BLUE CROSS/BLUE SHIELD | Admitting: Orthopaedic Surgery

## 2019-10-26 DIAGNOSIS — M25511 Pain in right shoulder: Secondary | ICD-10-CM

## 2019-10-26 DIAGNOSIS — G5602 Carpal tunnel syndrome, left upper limb: Secondary | ICD-10-CM

## 2019-10-26 DIAGNOSIS — G5601 Carpal tunnel syndrome, right upper limb: Secondary | ICD-10-CM | POA: Diagnosis not present

## 2019-10-26 MED ORDER — METHYLPREDNISOLONE ACETATE 40 MG/ML IJ SUSP
40.0000 mg | INTRAMUSCULAR | Status: AC | PRN
  Administered 2019-10-26: 40 mg

## 2019-10-26 MED ORDER — BUPIVACAINE HCL 0.5 % IJ SOLN
1.0000 mL | INTRAMUSCULAR | Status: AC | PRN
  Administered 2019-10-26: 1 mL

## 2019-10-26 MED ORDER — LIDOCAINE HCL 1 % IJ SOLN
1.0000 mL | INTRAMUSCULAR | Status: AC | PRN
  Administered 2019-10-26: 1 mL

## 2019-10-26 NOTE — Progress Notes (Signed)
Office Visit Note   Patient: Ellen Nelson           Date of Birth: 02-19-1956           MRN: [id number] Visit Date: 10/26/2019              Requested by: Rinaldo Cloud, MD [phone] [address] [place],  [place] [address] PCP: Rinaldo Cloud, MD   Assessment & Plan: Visit Diagnoses:  1. Right carpal tunnel syndrome   2. Left carpal tunnel syndrome   3. Acute pain of right shoulder     Plan: Nerve conduction studies show bilateral moderate carpal tunnel syndrome without any axonal injury or demyelination.  She also has a full-thickness supraspinatus tear based on MRI couple years ago.  In terms of the carpal tunnel syndrome we discussed injections versus surgical release and the pros and cons and she has elected to try cortisone injections first.  She tolerated these injections well today.  In terms of right shoulder we will need a new MRI to assess the supraspinatus tear for surgical planning.  We will see her back after the shoulder MRI.  Follow-Up Instructions: Return if symptoms worsen or fail to improve.   Orders:  Orders Placed This Encounter  Procedures  . MR SHOULDER RIGHT WO CONTRAST   No orders of the defined types were placed in this encounter.     Procedures: Hand/UE Inj: bilateral carpal tunnel for carpal tunnel syndrome on 10/26/2019 9:02 PM Indications: pain Details: 25 G needle Medications (Right): 1 mL lidocaine 1 %; 1 mL bupivacaine 0.5 %; 40 mg methylPREDNISolone acetate 40 MG/ML Medications (Left): 1 mL lidocaine 1 %; 1 mL bupivacaine 0.5 %; 40 mg methylPREDNISolone acetate 40 MG/ML Outcome: tolerated well, no immediate complications Patient was prepped and draped in the usual sterile fashion.       Clinical Data: No additional findings.   Subjective: Chief Complaint  Patient presents with  . Right Hand - Pain  . Left Hand - Pain    Ellen Nelson is a 63 year old female who is here to review nerve conduction studies as well as follow-up  for her chronic right shoulder pain.  She had a previous MRI a few years ago which showed a full-thickness supraspinatus tear.  At that time she decided that she did not have time to undergo surgery and be out of work but the shoulder has continued to bother her and now it is affecting her arm function.   Review of Systems  Constitutional: Negative.   HENT: Negative.   Eyes: Negative.   Respiratory: Negative.   Cardiovascular: Negative.   Endocrine: Negative.   Musculoskeletal: Negative.   Neurological: Negative.   Hematological: Negative.   Psychiatric/Behavioral: Negative.   All other systems reviewed and are negative.    Objective: Vital Signs: There were no vitals taken for this visit.  Physical Exam Vitals and nursing note reviewed.  Constitutional:      Appearance: She is well-developed.  Pulmonary:     Effort: Pulmonary effort is normal.  Skin:    General: Skin is warm.     Capillary Refill: Capillary refill takes less than 2 seconds.  Neurological:     Mental Status: She is alert and oriented to person, place, and time.  Psychiatric:        Behavior: Behavior normal.        Thought Content: Thought content normal.        Judgment: Judgment normal.  Ortho Exam Hand exams are unchanged. Right shoulder shows pain with elevation.  Her active and passive range of motion are normal.  She does have weakness with manual muscle testing of the supraspinatus. Specialty Comments:  No specialty comments available.  Imaging: No results found.   PMFS History: Patient Active Problem List   Diagnosis Date Noted  . Complete tear of right rotator cuff 09/05/2019  . Impingement syndrome of right shoulder 09/05/2019   Past Medical History:  Diagnosis Date  . Hypertension   . Mitral prolapse     Family History  Problem Relation Age of Onset  . Dementia Father     History reviewed. No pertinent surgical history. Social History   Occupational History  . Not on  file  Tobacco Use  . Smoking status: Former Research scientist (life sciences)  . Smokeless tobacco: Never Used  Substance and Sexual Activity  . Alcohol use: Yes    Comment: seldom  . Drug use: Never  . Sexual activity: Not on file

## 2019-11-20 ENCOUNTER — Ambulatory Visit (HOSPITAL_COMMUNITY)
Admission: EM | Admit: 2019-11-20 | Discharge: 2019-11-20 | Disposition: A | Discharge status: Home / Self Care | Payer: BLUE CROSS/BLUE SHIELD | Attending: Physician Assistant | Admitting: Physician Assistant

## 2019-11-20 ENCOUNTER — Other Ambulatory Visit: Payer: Self-pay

## 2019-11-20 ENCOUNTER — Encounter (HOSPITAL_COMMUNITY): Payer: Self-pay

## 2019-11-20 DIAGNOSIS — H6121 Impacted cerumen, right ear: Secondary | ICD-10-CM

## 2019-11-20 DIAGNOSIS — J0100 Acute maxillary sinusitis, unspecified: Secondary | ICD-10-CM

## 2019-11-20 HISTORY — DX: Allergy, unspecified, initial encounter: T78.40XA

## 2019-11-20 MED ORDER — AFRIN NASAL SPRAY 0.05 % NA SOLN: 1.0000 | Freq: Two times a day (BID) | NASAL | 0 refills | Status: AC

## 2019-11-20 MED ORDER — AMOXICILLIN-POT CLAVULANATE 875-125 MG PO TABS: 1.0000 | ORAL_TABLET | Freq: Two times a day (BID) | ORAL | 0 refills | Status: AC

## 2019-11-20 NOTE — ED Provider Notes (Signed)
[facility]    CSN: [id number] Arrival date & time: 11/20/19  1450      History   Chief Complaint Chief Complaint  Patient presents with  . Ear Itchiness  . Nose Drainage  . Nose Burning    HPI Ellen Nelson is a 64 y.o. female.   Patient with history of chronic issues with her sinuses presents with acute on chronic sinus flare.  She reports she chronically has issues with sinus congestion however the last 4 days she has had significant worsening sinus congestion.  She reports lots of nasal drainage.  She reports this is causing facial pain.  She reports it is also causing her to feel little lightheaded when turning her head fast.  She reports some itching feeling in her ears and decreased hearing in the right ear.  She feels it may be wax in it.  She does not clean her ears with Q-tips, occasionally uses warm water and peroxide.  She has had a slight cough, denies chest pain or shortness of breath.  She reports the episodes of lightheadedness occurred while turning her head quickly while working in the kitchen.  She reports 2 episodes only.  This was not a room spinning sensation.  She is not currently complaining of feeling lightheaded or dizzy.  Denies any ringing in her ears.  She has been using Flonase twice a day as well as allergy medicines and this is not helping at all.     Past Medical History:  Diagnosis Date  . Allergy   . Hypertension   . Mitral prolapse     Patient Active Problem List   Diagnosis Date Noted  . Complete tear of right rotator cuff 09/05/2019  . Impingement syndrome of right shoulder 09/05/2019    History reviewed. No pertinent surgical history.  OB History   No obstetric history on file.      Home Medications    Prior to Admission medications   Medication Sig Start Date End Date Taking? Authorizing Provider  amoxicillin-clavulanate (AUGMENTIN) 875-125 MG tablet Take 1 tablet by mouth 2 (two) times daily for 10 days.  11/20/19 11/30/19  Darr, Veryl Speak, PA-C  cholecalciferol (VITAMIN D) 1000 units tablet Take 2,000 Units by mouth daily.     [provider]  fexofenadine (ALLEGRA) 60 MG tablet Take 60 mg by mouth daily.     [provider]  fluticasone (FLONASE) 50 MCG/ACT nasal spray Place 2 sprays into both nostrils daily. 05/15/18   Eustace Moore, MD  losartan (COZAAR) 100 MG tablet Take 100 mg by mouth daily.    [provider]  Multiple Vitamins-Minerals (HAIR SKIN AND NAILS FORMULA PO) Take 1 tablet by mouth daily.    [provider]  oxymetazoline (AFRIN NASAL SPRAY) 0.05 % nasal spray Place 1 spray into both nostrils 2 (two) times daily for 3 days. 11/20/19 11/23/19  Darr, Veryl Speak, PA-C  polycarbophil (FIBERCON) 625 MG tablet Take 1,250 mg by mouth 2 (two) times daily.    [provider]  vitamin B-12 (CYANOCOBALAMIN) 100 MCG tablet Take 100 mcg by mouth daily.    [provider]  vitamin C (ASCORBIC ACID) 500 MG tablet Take 500 mg by mouth daily.    [provider]    Family History Family History  Problem Relation Age of Onset  . Dementia Father     Social History Social History   Tobacco Use  . Smoking status: Former Games developer  . Smokeless  tobacco: Never Used  Vaping Use  . Vaping Use: Never used  Substance Use Topics  . Alcohol use: Yes    Comment: seldom  . Drug use: Never     Allergies   Sulfa antibiotics   Review of Systems Review of Systems   Physical Exam Triage Vital Signs ED Triage Vitals [11/20/19 1547]  Enc Vitals Group     BP 123/69     Pulse Rate 69     Resp 17     Temp 97.9 F (36.6 C)     Temp Source Oral     SpO2 100 %     Weight      Height      Head Circumference      Peak Flow      Pain Score 0     Pain Loc      Pain Edu?      Excl. in GC?    No data found.  Updated Vital Signs BP 123/69 (BP Location: Left Arm)   Pulse 69   Temp 97.9 F (36.6 C) (Oral)   Resp 17   SpO2 100%    Visual Acuity Right Eye Distance:   Left Eye Distance:   Bilateral Distance:    Right Eye Near:   Left Eye Near:    Bilateral Near:     Physical Exam Vitals and nursing note reviewed.  Constitutional:      General: She is not in acute distress.    Appearance: She is well-developed. She is not ill-appearing.  HENT:     Head: Normocephalic and atraumatic.     Comments: Maxillary tenderness.    Ears:     Comments: Right ear with seen amount of cerumen, easily removed with curette.  Tympanic membrane intact, slight serous fluid  Left ear canal without obstruction.  TM pearly gray with slight serous effusion    Nose: Congestion and rhinorrhea present.     Comments: Turbinates bilaterally with large amount of swelling and congestion.    Mouth/Throat:     Mouth: Mucous membranes are moist.     Pharynx: Oropharynx is clear.  Eyes:     Extraocular Movements: Extraocular movements intact.     Conjunctiva/sclera: Conjunctivae normal.     Pupils: Pupils are equal, round, and reactive to light.  Cardiovascular:     Rate and Rhythm: Normal rate and regular rhythm.     Heart sounds: No murmur heard.   Pulmonary:     Effort: Pulmonary effort is normal. No respiratory distress.     Breath sounds: Normal breath sounds.  Abdominal:     Palpations: Abdomen is soft.     Tenderness: There is no abdominal tenderness.  Musculoskeletal:     Cervical back: Neck supple.     Right lower leg: No edema.     Left lower leg: No edema.  Skin:    General: Skin is warm and dry.  Neurological:     Mental Status: She is alert.      UC Treatments / Results  Labs (all labs ordered are listed, but only abnormal results are displayed) Labs Reviewed - No data to display  EKG   Radiology No results found.  Procedures Procedures (including critical care time)  Medications Ordered in UC Medications - No data to display  Initial Impression / Assessment and Plan / UC Course  I have reviewed  the triage vital signs and the nursing notes.  Pertinent labs & imaging results that were  available during my care of the patient were reviewed by me and considered in my medical decision making (see chart for details).     #Acute sinusitis #Cerumen in ear canal Patient is a 64 year old presenting with acute sinusitis.  Believe her lightheaded sensation is from sinus pressure and pressure on the ear.  Given chronic sinus symptoms with acute flare, we will treat her with Augmentin and a very short course of Afrin for immediate symptom relief.  Instructed if lightheadedness persists or worsens that she needs to report to urgent care or the emergency department.  Also instructed her to follow-up with her primary care in about a week.  Discussed use of Debrox intermittently if she feels that she has blockage.  Return and fall precautions discussed.  Patient verbalized understanding plan of care. Final Clinical Impressions(s) / UC Diagnoses   Final diagnoses:  Acute maxillary sinusitis, recurrence not specified  Impacted cerumen of right ear     Discharge Instructions     Take the antibiotic 2 times a day for 10 days Use the afrin 2 times a day for the next 2 days only, then completely stop this medication  Continue your current sinus regiment  Schedule follow up with your PCP in about 1 week  Use debrox as needed for ear wax      ED Prescriptions    Medication Sig Dispense Auth. Provider   amoxicillin-clavulanate (AUGMENTIN) 875-125 MG tablet Take 1 tablet by mouth 2 (two) times daily for 10 days. 20 tablet Darr, Veryl Speak, PA-C   oxymetazoline (AFRIN NASAL SPRAY) 0.05 % nasal spray Place 1 spray into both nostrils 2 (two) times daily for 3 days. 0.6 mL Darr, Veryl Speak, PA-C     PDMP not reviewed this encounter.   Hermelinda Medicus, PA-C 11/20/19 1652

## 2019-11-20 NOTE — ED Triage Notes (Signed)
Pt presents with bilateral ear itchiness, nasal drainage, and nose burning.  Pt has chronic allergy issue and OTC medication does not bring relief.

## 2019-11-20 NOTE — Discharge Instructions (Signed)
Take the antibiotic 2 times a day for 10 days Use the afrin 2 times a day for the next 2 days only, then completely stop this medication  Continue your current sinus regiment  Schedule follow up with your PCP in about 1 week  Use debrox as needed for ear wax

## 2019-11-22 ENCOUNTER — Telehealth: Payer: Self-pay | Admitting: *Deleted

## 2019-11-22 NOTE — Telephone Encounter (Signed)
Pt called stating she now has [employer], I called pt back left vm to return call with [facility] ID information so I can proceed with getting her MRI authorized and scheduled with [facility].

## 2019-11-23 NOTE — Telephone Encounter (Signed)
Pt called back yesterday and left voice message with [facility] id [id number], I submitted the order to [facility] for authorization

## 2019-11-24 ENCOUNTER — Other Ambulatory Visit: Payer: BLUE CROSS/BLUE SHIELD

## 2019-12-01 ENCOUNTER — Ambulatory Visit
Admission: RE | Admit: 2019-12-01 | Discharge: 2019-12-01 | Disposition: A | Discharge status: Home / Self Care | Payer: BLUE CROSS/BLUE SHIELD | Source: Ambulatory Visit | Attending: Orthopaedic Surgery | Admitting: Orthopaedic Surgery

## 2019-12-01 ENCOUNTER — Other Ambulatory Visit: Payer: Self-pay

## 2019-12-01 DIAGNOSIS — M25511 Pain in right shoulder: Secondary | ICD-10-CM

## 2019-12-04 ENCOUNTER — Ambulatory Visit (INDEPENDENT_AMBULATORY_CARE_PROVIDER_SITE_OTHER): Payer: 59 | Admitting: Orthopaedic Surgery

## 2019-12-04 ENCOUNTER — Telehealth: Payer: Self-pay | Admitting: Orthopaedic Surgery

## 2019-12-04 ENCOUNTER — Encounter: Payer: Self-pay | Admitting: Orthopaedic Surgery

## 2019-12-04 VITALS — Ht 67.5 in | Wt 227.0 lb

## 2019-12-04 DIAGNOSIS — M75121 Complete rotator cuff tear or rupture of right shoulder, not specified as traumatic: Secondary | ICD-10-CM

## 2019-12-04 DIAGNOSIS — M67921 Unspecified disorder of synovium and tendon, right upper arm: Secondary | ICD-10-CM | POA: Diagnosis not present

## 2019-12-04 NOTE — Telephone Encounter (Signed)
Patient called requesting a doctor's note for work. Patient states she needs note for limitations at work. Please call patient when note is ready for pick up. Patient phone number is [phone].

## 2019-12-04 NOTE — Progress Notes (Signed)
Office Visit Note   Patient: Ellen Nelson           Date of Birth: Nov 06, 1955           MRN: [id number] Visit Date: 12/04/2019              Requested by: Rinaldo Cloud, MD [phone] [address] [place],  [place] [address] PCP: Rinaldo Cloud, MD   Assessment & Plan: Visit Diagnoses:  1. Nontraumatic complete tear of right rotator cuff   2. Biceps tendinopathy of right upper extremity     Plan: MRI of the right shoulder shows a complete tear of the supraspinatus tendon with 3 cm of retraction.  Severe tendinosis of the infraspinatus.  No muscle atrophy.  She does have proximal biceps tendinopathy with degenerative superior labral tear.  These findings were reviewed with the patient in detail and based on our discussion of her options she is elected to proceed with rotator cuff repair and extensive debridement to include biceps tenotomy and subacromial decompression.  Risk benefits rehab recovery reviewed in detail today.  All questions answered to her satisfaction.  We will obtain preoperative cardiac clearance from Dr. Sharyn Lull prior to scheduling surgery.  Follow-Up Instructions: Return if symptoms worsen or fail to improve.   Orders:  No orders of the defined types were placed in this encounter.  No orders of the defined types were placed in this encounter.     Procedures: No procedures performed   Clinical Data: No additional findings.   Subjective: Chief Complaint  Patient presents with  . Right Shoulder - Pain    [name] returns today for MRI review of the right shoulder.  No changes reported.   Review of Systems  Constitutional: Negative.   HENT: Negative.   Eyes: Negative.   Respiratory: Negative.   Cardiovascular: Negative.   Endocrine: Negative.   Musculoskeletal: Negative.   Neurological: Negative.   Hematological: Negative.   Psychiatric/Behavioral: Negative.   All other systems reviewed and are negative.    Objective: Vital Signs:  Ht 5' 7.5" (1.715 m)   Wt 227 lb (103 kg)   BMI 35.03 kg/m   Physical Exam Vitals and nursing note reviewed.  Constitutional:      Appearance: She is well-developed.  Pulmonary:     Effort: Pulmonary effort is normal.  Skin:    General: Skin is warm.     Capillary Refill: Capillary refill takes less than 2 seconds.  Neurological:     Mental Status: She is alert and oriented to person, place, and time.  Psychiatric:        Behavior: Behavior normal.        Thought Content: Thought content normal.        Judgment: Judgment normal.     Ortho Exam Right shoulder exam is unchanged. Specialty Comments:  No specialty comments available.  Imaging: No results found.   PMFS History: Patient Active Problem List   Diagnosis Date Noted  . Biceps tendinopathy of right upper extremity 12/04/2019  . Complete tear of right rotator cuff 09/05/2019  . Impingement syndrome of right shoulder 09/05/2019   Past Medical History:  Diagnosis Date  . Allergy   . Hypertension   . Mitral prolapse     Family History  Problem Relation Age of Onset  . Dementia Father     History reviewed. No pertinent surgical history. Social History   Occupational History  . Not on file  Tobacco Use  . Smoking  status: Former Games developer  . Smokeless tobacco: Never Used  Vaping Use  . Vaping Use: Never used  Substance and Sexual Activity  . Alcohol use: Yes    Comment: seldom  . Drug use: Never  . Sexual activity: Not on file

## 2019-12-05 NOTE — Telephone Encounter (Signed)
Please advise 

## 2019-12-05 NOTE — Telephone Encounter (Signed)
She is not able to lift more than 10 pounds

## 2019-12-06 ENCOUNTER — Telehealth: Payer: Self-pay

## 2019-12-06 NOTE — Telephone Encounter (Signed)
Patient called in wanting to pain medication . Also wanted to speak about what else can she take to manage pain.

## 2019-12-07 ENCOUNTER — Other Ambulatory Visit: Payer: Self-pay | Admitting: Physician Assistant

## 2019-12-07 ENCOUNTER — Telehealth: Payer: Self-pay | Admitting: Physician Assistant

## 2019-12-07 MED ORDER — TRAMADOL HCL 50 MG PO TABS: 50.0000 mg | ORAL_TABLET | Freq: Two times a day (BID) | ORAL | 0 refills | Status: DC | PRN

## 2019-12-07 MED ORDER — METHOCARBAMOL 500 MG PO TABS: 500.0000 mg | ORAL_TABLET | Freq: Two times a day (BID) | ORAL | 0 refills | Status: DC | PRN

## 2019-12-07 NOTE — Telephone Encounter (Signed)
Please advise 

## 2019-12-07 NOTE — Telephone Encounter (Signed)
Patient called advised she is not taking anything. Patient asked if the Tramadol and a muscle relaxer can be called into her pharmacy? Patient uses [facility] on [address]. Patient also said she has been cleared for surgery. Patient said Dr Eduardo Osier. Harwani is waiting for the clearance form.  The number to contact patient is [phone]

## 2019-12-07 NOTE — Telephone Encounter (Signed)
Letter made. Ready for pick up at the front desk. Patient did not answer LMOM.

## 2019-12-07 NOTE — Telephone Encounter (Signed)
What all is she taking?  I can call in tramadol and a muscle relaxer if not already taking

## 2019-12-07 NOTE — Telephone Encounter (Signed)
Called patient no answer. LMOM. Please advise on message below.

## 2019-12-07 NOTE — Telephone Encounter (Signed)
Mardella Layman can you please send these meds into her pharm.    Sherrie see message below about being cleared from [facility].

## 2019-12-10 ENCOUNTER — Other Ambulatory Visit: Payer: Self-pay | Admitting: Physician Assistant

## 2019-12-10 NOTE — Telephone Encounter (Signed)
I sent in friday

## 2019-12-10 NOTE — Telephone Encounter (Signed)
Faxed clearance request to Dr. Sharyn Lull.

## 2020-05-31 ENCOUNTER — Ambulatory Visit (HOSPITAL_COMMUNITY)
Admission: EM | Admit: 2020-05-31 | Discharge: 2020-05-31 | Disposition: A | Discharge status: Home / Self Care | Payer: 59 | Attending: Emergency Medicine | Admitting: Emergency Medicine

## 2020-05-31 ENCOUNTER — Encounter (HOSPITAL_COMMUNITY): Payer: Self-pay | Admitting: Emergency Medicine

## 2020-05-31 ENCOUNTER — Other Ambulatory Visit: Payer: Self-pay

## 2020-05-31 ENCOUNTER — Ambulatory Visit (INDEPENDENT_AMBULATORY_CARE_PROVIDER_SITE_OTHER): Payer: 59

## 2020-05-31 DIAGNOSIS — B349 Viral infection, unspecified: Secondary | ICD-10-CM | POA: Diagnosis present

## 2020-05-31 DIAGNOSIS — U071 COVID-19: Secondary | ICD-10-CM | POA: Diagnosis not present

## 2020-05-31 DIAGNOSIS — R059 Cough, unspecified: Secondary | ICD-10-CM

## 2020-05-31 NOTE — Discharge Instructions (Addendum)
Rest,push fluids, take otc meds as directed  for symptom management. Follow up with PCP. No antibiotics indicated at present, chest xray was normal. Continue home meds. Covid test pending, review result in my chart.

## 2020-05-31 NOTE — ED Triage Notes (Signed)
Pt states that she has a cough, nasal congestion, HA, and slight diarrhea. Pt states that her sx has been on and off since thanksgiving. Pt was exposed to covid. Pt also states that she was tested last Tuesday but haven heard back yet.

## 2020-05-31 NOTE — ED Provider Notes (Signed)
[facility]    CSN: [id number] Arrival date & time: 05/31/20  1419      History   Chief Complaint Chief Complaint  Patient presents with  . Cough  . Nasal Congestion  . Headache  . Diarrhea    HPI Ellen Nelson is a 65 y.o. female.   65 year old female presents to urgent care with chief complaint of cough nasal congestion headache diarrhea.  Diarrhea has improved symptoms persist here for COVID testing and chest x-ray  The history is provided by the patient. No language interpreter was used.    Past Medical History:  Diagnosis Date  . Allergy   . Hypertension   . Mitral prolapse     Patient Active Problem List   Diagnosis Date Noted  . Nonspecific syndrome suggestive of viral illness 05/31/2020  . Biceps tendinopathy of right upper extremity 12/04/2019  . Complete tear of right rotator cuff 09/05/2019  . Impingement syndrome of right shoulder 09/05/2019    History reviewed. No pertinent surgical history.  OB History   No obstetric history on file.      Home Medications    Prior to Admission medications   Medication Sig Start Date End Date Taking? Authorizing Provider  cholecalciferol (VITAMIN D) 1000 units tablet Take 2,000 Units by mouth daily.     [provider]  fexofenadine (ALLEGRA) 60 MG tablet Take 60 mg by mouth daily.     [provider]  fluticasone (FLONASE) 50 MCG/ACT nasal spray Place 2 sprays into both nostrils daily. 05/15/18   Eustace Moore, MD  losartan (COZAAR) 100 MG tablet Take 100 mg by mouth daily.    [provider]  methocarbamol (ROBAXIN) 500 MG tablet Take 1 tablet (500 mg total) by mouth 2 (two) times daily as needed. 12/07/19   Cristie Hem, PA-C  Multiple Vitamins-Minerals (HAIR SKIN AND NAILS FORMULA PO) Take 1 tablet by mouth daily.    [provider]  polycarbophil (FIBERCON) 625 MG tablet Take 1,250 mg by mouth 2 (two) times daily.    [provider]   traMADol (ULTRAM) 50 MG tablet Take 1-2 tablets (50-100 mg total) by mouth 2 (two) times daily as needed. 12/07/19   Cristie Hem, PA-C  vitamin B-12 (CYANOCOBALAMIN) 100 MCG tablet Take 100 mcg by mouth daily.    [provider]  vitamin C (ASCORBIC ACID) 500 MG tablet Take 500 mg by mouth daily.    [provider]    Family History Family History  Problem Relation Age of Onset  . Dementia Father     Social History Social History   Tobacco Use  . Smoking status: Former Games developer  . Smokeless tobacco: Never Used  Vaping Use  . Vaping Use: Never used  Substance Use Topics  . Alcohol use: Yes    Comment: seldom  . Drug use: Never     Allergies   Sulfa antibiotics   Review of Systems Review of Systems  Constitutional: Negative for chills and fever.  HENT: Positive for congestion and sore throat. Negative for ear pain and rhinorrhea.   Eyes: Negative.   Respiratory: Negative for cough.   Gastrointestinal: Negative for nausea and vomiting.  Endocrine: Negative.   Genitourinary: Negative for dysuria.  Musculoskeletal: Negative for myalgias.  Skin: Negative for rash.  Allergic/Immunologic: Negative.   Neurological: Positive for headaches.  Hematological: Negative.   Psychiatric/Behavioral: Negative.   All other systems reviewed and are negative.    Physical Exam  Triage Vital Signs ED Triage Vitals  Enc Vitals Group     BP 05/31/20 1628 126/71     Pulse Rate 05/31/20 1628 65     Resp 05/31/20 1628 18     Temp 05/31/20 1628 98 F (36.7 C)     Temp Source 05/31/20 1628 Oral     SpO2 05/31/20 1628 96 %     Weight --      Height --      Head Circumference --      Peak Flow --      Pain Score 05/31/20 1627 0     Pain Loc --      Pain Edu? --      Excl. in GC? --    No data found.  Updated Vital Signs BP 126/71 (BP Location: Left Arm)   Pulse 65   Temp 98 F (36.7 C) (Oral)   Resp 18   SpO2 96%   Visual Acuity Right Eye Distance:    Left Eye Distance:   Bilateral Distance:    Right Eye Near:   Left Eye Near:    Bilateral Near:     Physical Exam Vitals and nursing note reviewed.  Constitutional:      General: She is active. She is not in acute distress.    Appearance: She is well-developed and well-nourished.  HENT:     Head: Normocephalic.     Mouth/Throat:     Mouth: Oropharynx is clear and moist.  Eyes:     General: Lids are normal.     Extraocular Movements: EOM normal.     Conjunctiva/sclera: Conjunctivae normal.     Pupils: Pupils are equal, round, and reactive to light.  Neck:     Trachea: No tracheal deviation.  Cardiovascular:     Rate and Rhythm: Regular rhythm.     Pulses: Normal pulses.     Heart sounds: Normal heart sounds. No murmur heard.   Pulmonary:     Effort: Pulmonary effort is normal.     Breath sounds: Normal breath sounds.  Abdominal:     General: Bowel sounds are normal.     Palpations: Abdomen is soft.     Tenderness: There is no abdominal tenderness.  Musculoskeletal:        General: Normal range of motion.     Cervical back: Normal range of motion.  Lymphadenopathy:     Cervical: No cervical adenopathy.  Skin:    General: Skin is warm and dry.     Findings: No rash.  Neurological:     Mental Status: She is alert and oriented to person, place, and time.     GCS: GCS eye subscore is 4. GCS verbal subscore is 5. GCS motor subscore is 6.  Psychiatric:        Mood and Affect: Mood and affect normal.        Speech: Speech normal.        Behavior: Behavior normal. Behavior is cooperative.      UC Treatments / Results  Labs (all labs ordered are listed, but only abnormal results are displayed) Labs Reviewed  SARS CORONAVIRUS 2 (TAT 6-24 HRS)    EKG   Radiology DG Chest 2 View  Result Date: 05/31/2020 CLINICAL DATA:  Cough EXAM: CHEST - 2 VIEW COMPARISON:  September 12, 2017 FINDINGS: The cardiomediastinal silhouette is normal in contour. No pleural effusion. No  pneumothorax. No acute pleuroparenchymal abnormality. Visualized abdomen is unremarkable. Mild degenerative changes of the  superior lumbar spine. IMPRESSION: No acute cardiopulmonary abnormality. Electronically Signed   By: Meda Klinefelter MD   On: 05/31/2020 17:08    Procedures Procedures (including critical care time)  Medications Ordered in UC Medications - No data to display  Initial Impression / Assessment and Plan / UC Course  I have reviewed the triage vital signs and the nursing notes.  Pertinent labs & imaging results that were available during my care of the patient were reviewed by me and considered in my medical decision making (see chart for details).     DDX: Viral illness, laryngitis, COVID, influenza Final Clinical Impressions(s) / UC Diagnoses   Final diagnoses:  Nonspecific syndrome suggestive of viral illness     Discharge Instructions     Rest,push fluids, take otc meds as directed  for symptom management. Follow up with PCP. No antibiotics indicated at present, chest xray was normal. Continue home meds. Covid test pending, review result in my chart.     ED Prescriptions    None     PDMP not reviewed this encounter.   Clancy Gourd, NP 05/31/20 1918

## 2020-06-01 LAB — SARS CORONAVIRUS 2 (TAT 6-24 HRS): SARS Coronavirus 2: POSITIVE — AB

## 2020-06-02 ENCOUNTER — Telehealth: Payer: Self-pay | Admitting: Oncology

## 2020-06-02 ENCOUNTER — Ambulatory Visit (HOSPITAL_COMMUNITY)
Admission: RE | Admit: 2020-06-02 | Discharge: 2020-06-02 | Disposition: A | Discharge status: Home / Self Care | Payer: 59 | Source: Ambulatory Visit | Attending: Pulmonary Disease | Admitting: Pulmonary Disease

## 2020-06-02 ENCOUNTER — Encounter: Payer: Self-pay | Admitting: Physician Assistant

## 2020-06-02 ENCOUNTER — Other Ambulatory Visit: Payer: Self-pay | Admitting: Oncology

## 2020-06-02 DIAGNOSIS — U071 COVID-19: Secondary | ICD-10-CM

## 2020-06-02 NOTE — Telephone Encounter (Signed)
Called to discuss with patient about COVID-19 symptoms and the use of one of the available treatments for those with mild to moderate Covid symptoms and at a high risk of hospitalization.  Pt appears to qualify for outpatient treatment due to co-morbid conditions and/or a member of an at-risk group in accordance with the FDA Emergency Use Authorization.    Symptom onset: unsure  Vaccinated: No   Booster? No  Qualifiers: Htn, obesity,   Good candidate for oral antivirals.   Unable to reach pt - Left VM and sent text message.    Ellen Nelson

## 2020-06-02 NOTE — Progress Notes (Signed)
I connected by phone with Ellen Nelson on 06/02/2020 at 1:46 PM to discuss the potential use of a new treatment for mild to moderate COVID-19 viral infection in non-hospitalized patients.  This patient is a 65 y.o. female that meets the FDA criteria for Emergency Use Authorization of COVID monoclonal antibody sotrovimab.  Has a (+) direct SARS-CoV-2 viral test result  Has mild or moderate COVID-19   Is NOT hospitalized due to COVID-19  Is within 10 days of symptom onset  Has at least one of the high risk factor(s) for progression to severe COVID-19 and/or hospitalization as defined in EUA.  Specific high risk criteria : BMI > 25, Chronic Lung Disease and Other high risk medical condition per CDC:  unvaccinated   I have spoken and communicated the following to the patient or parent/caregiver regarding COVID monoclonal antibody treatment:  1. FDA has authorized the emergency use for the treatment of mild to moderate COVID-19 in adults and pediatric patients with positive results of direct SARS-CoV-2 viral testing who are 7 years of age and older weighing at least 40 kg, and who are at high risk for progressing to severe COVID-19 and/or hospitalization.  2. The significant known and potential risks and benefits of COVID monoclonal antibody, and the extent to which such potential risks and benefits are unknown.  3. Information on available alternative treatments and the risks and benefits of those alternatives, including clinical trials.  4. Patients treated with COVID monoclonal antibody should continue to self-isolate and use infection control measures (e.g., wear mask, isolate, social distance, avoid sharing personal items, clean and disinfect "high touch" surfaces, and frequent handwashing) according to CDC guidelines.   5. The patient or parent/caregiver has the option to accept or refuse COVID monoclonal antibody treatment.  After reviewing this information with the patient, the  patient has agreed to receive one of the available covid 19 monoclonal antibodies and will be provided an appropriate fact sheet prior to infusion. Mauro Kaufmann, NP 06/02/2020 1:46 PM

## 2020-06-04 ENCOUNTER — Ambulatory Visit (HOSPITAL_COMMUNITY)
Admission: RE | Admit: 2020-06-04 | Discharge: 2020-06-04 | Disposition: A | Discharge status: Home / Self Care | Payer: 59 | Source: Ambulatory Visit | Attending: Pulmonary Disease | Admitting: Pulmonary Disease

## 2020-06-04 DIAGNOSIS — Z6825 Body mass index (BMI) 25.0-25.9, adult: Secondary | ICD-10-CM | POA: Diagnosis not present

## 2020-06-04 DIAGNOSIS — U071 COVID-19: Secondary | ICD-10-CM | POA: Diagnosis not present

## 2020-06-04 MED ORDER — SOTROVIMAB 500 MG/8ML IV SOLN
500.0000 mg | Freq: Once | INTRAVENOUS | Status: AC
  Administered 2020-06-04: 500 mg via INTRAVENOUS

## 2020-06-04 MED ORDER — ALBUTEROL SULFATE HFA 108 (90 BASE) MCG/ACT IN AERS: 2.0000 | INHALATION_SPRAY | Freq: Once | RESPIRATORY_TRACT | Status: DC | PRN

## 2020-06-04 MED ORDER — METHYLPREDNISOLONE SODIUM SUCC 125 MG IJ SOLR: 125.0000 mg | Freq: Once | INTRAMUSCULAR | Status: DC | PRN

## 2020-06-04 MED ORDER — DIPHENHYDRAMINE HCL 50 MG/ML IJ SOLN: 50.0000 mg | Freq: Once | INTRAMUSCULAR | Status: DC | PRN

## 2020-06-04 MED ORDER — SODIUM CHLORIDE 0.9 % IV SOLN: INTRAVENOUS | Status: DC | PRN

## 2020-06-04 MED ORDER — FAMOTIDINE IN NACL 20-0.9 MG/50ML-% IV SOLN: 20.0000 mg | Freq: Once | INTRAVENOUS | Status: DC | PRN

## 2020-06-04 MED ORDER — EPINEPHRINE 0.3 MG/0.3ML IJ SOAJ: 0.3000 mg | Freq: Once | INTRAMUSCULAR | Status: DC | PRN

## 2020-06-04 NOTE — Progress Notes (Signed)
Patient reviewed Fact Sheet for Patients, Parents, and Caregivers for Emergency Use Authorization (EUA) of sotrovimab for the Treatment of Coronavirus. Patient also reviewed and is agreeable to the estimated cost of treatment. Patient is agreeable to proceed.   

## 2020-06-04 NOTE — Discharge Instructions (Signed)

## 2020-06-04 NOTE — Progress Notes (Signed)
Diagnosis: COVID-19  Physician: Dr. Patrick Wright  Procedure: [facility] Med: Sotrovimab infusion - Provided patient with sotrovimab fact sheet for patients, parents, and caregivers prior to infusion.   Complications: No immediate complications noted  Discharge: Discharged home    

## 2020-09-09 ENCOUNTER — Ambulatory Visit (INDEPENDENT_AMBULATORY_CARE_PROVIDER_SITE_OTHER): Payer: 59

## 2020-09-09 ENCOUNTER — Other Ambulatory Visit: Payer: Self-pay

## 2020-09-09 ENCOUNTER — Ambulatory Visit: Payer: Self-pay

## 2020-09-09 ENCOUNTER — Ambulatory Visit (INDEPENDENT_AMBULATORY_CARE_PROVIDER_SITE_OTHER): Payer: 59 | Admitting: Orthopaedic Surgery

## 2020-09-09 DIAGNOSIS — M79641 Pain in right hand: Secondary | ICD-10-CM

## 2020-09-09 DIAGNOSIS — M79642 Pain in left hand: Secondary | ICD-10-CM | POA: Diagnosis not present

## 2020-09-09 MED ORDER — PREDNISONE 10 MG (21) PO TBPK: ORAL_TABLET | ORAL | 0 refills | Status: DC

## 2020-09-09 NOTE — Progress Notes (Signed)
Office Visit Note   Patient: Ellen Nelson           Date of Birth: 07/13/1955           MRN: [id number] Visit Date: 09/09/2020              Requested by: Rinaldo Cloud, MD [phone] [address] [place],  [place] [address] PCP: Rinaldo Cloud, MD   Assessment & Plan: Visit Diagnoses:  1. Bilateral hand pain     Plan: My impression is that she has some inflammatory condition going on.  I would like to get an arthritis panel today.  We will place her on prednisone Dosepak for the next week.  I do not get the impression that she has a carpal tunnel syndrome based on findings today.  Will follow up with the lab work in the next couple days.  Follow-Up Instructions: Return if symptoms worsen or fail to improve.   Orders:  Orders Placed This Encounter  Procedures  . XR Hand Complete Right  . XR Hand Complete Left  . Uric acid  . Sedimentation rate  . ANA  . Rheumatoid Factor   Meds ordered this encounter  Medications  . DISCONTD: predniSONE (STERAPRED UNI-PAK 21 TAB) 10 MG (21) TBPK tablet    Sig: Take as directed    Dispense:  21 tablet    Refill:  0  . predniSONE (STERAPRED UNI-PAK 21 TAB) 10 MG (21) TBPK tablet    Sig: Take as directed    Dispense:  21 tablet    Refill:  0      Procedures: No procedures performed   Clinical Data: No additional findings.   Subjective: Chief Complaint  Patient presents with  . Other     Bilateral hand pain     Ellen Nelson is 65 year old female well-known to me for evaluation of bilateral hand pain and swelling.  The left hand is worse than the right.  She is mainly complaining of left long and ring finger swelling and numbness and tingling.  The symptoms do wake her up.  She is a Production designer, theatre/television/film at [employer].  She complains of a decreased strength in grip at times.  She has had to miss some work because of the symptoms.  She feels like her fingertips are blistered.  Denies history of gout.   Review of Systems   Constitutional: Negative.   HENT: Negative.   Eyes: Negative.   Respiratory: Negative.   Cardiovascular: Negative.   Endocrine: Negative.   Musculoskeletal: Negative.   Neurological: Negative.   Hematological: Negative.   Psychiatric/Behavioral: Negative.   All other systems reviewed and are negative.    Objective: Vital Signs: There were no vitals taken for this visit.  Physical Exam Vitals and nursing note reviewed.  Constitutional:      Appearance: She is well-developed.  Pulmonary:     Effort: Pulmonary effort is normal.  Skin:    General: Skin is warm.     Capillary Refill: Capillary refill takes less than 2 seconds.  Neurological:     Mental Status: She is alert and oriented to person, place, and time.  Psychiatric:        Behavior: Behavior normal.        Thought Content: Thought content normal.        Judgment: Judgment normal.     Ortho Exam Left hand shows moderate swelling to the dorsum of the hand and the long and ring fingers.  Negative  carpal tunnel compressive signs.  She has mild pain with making a full composite fist.  Normal skin color temperature and turgor.  Right hand shows mild swelling of the long and ring fingers.  She is unable to straighten her ring finger out completely.  Negative carpal tunnel compressive signs. Specialty Comments:  No specialty comments available.  Imaging: XR Hand Complete Left  Result Date: 09/09/2020 No acute or structural abnormalities  XR Hand Complete Right  Result Date: 09/09/2020 No acute or structural abnormalities.    PMFS History: Patient Active Problem List   Diagnosis Date Noted  . Nonspecific syndrome suggestive of viral illness 05/31/2020  . Biceps tendinopathy of right upper extremity 12/04/2019  . Complete tear of right rotator cuff 09/05/2019  . Impingement syndrome of right shoulder 09/05/2019   Past Medical History:  Diagnosis Date  . Allergy   . Hypertension   . Mitral prolapse      Family History  Problem Relation Age of Onset  . Dementia Father     No past surgical history on file. Social History   Occupational History  . Not on file  Tobacco Use  . Smoking status: Former Games developer  . Smokeless tobacco: Never Used  Vaping Use  . Vaping Use: Never used  Substance and Sexual Activity  . Alcohol use: Yes    Comment: seldom  . Drug use: Never  . Sexual activity: Not on file

## 2020-09-12 ENCOUNTER — Telehealth: Payer: Self-pay

## 2020-09-12 LAB — SEDIMENTATION RATE: Sed Rate: 25 mm/h (ref 0–30)

## 2020-09-12 LAB — URIC ACID: Uric Acid, Serum: 4.4 mg/dL (ref 2.5–7.0)

## 2020-09-12 LAB — ANTI-NUCLEAR AB-TITER (ANA TITER): ANA Titer 1: 1:80 {titer} — ABNORMAL HIGH

## 2020-09-12 LAB — RHEUMATOID FACTOR: Rheumatoid fact SerPl-aCnc: <14 IU/mL (ref <14)

## 2020-09-12 LAB — ANA: Anti Nuclear Antibody (ANA): POSITIVE — AB

## 2020-09-12 NOTE — Progress Notes (Signed)
Please let patient know the lab work is concerning for autoimmune disease.  We need to refer to rheumatology.  Thanks.

## 2020-09-12 NOTE — Progress Notes (Signed)
Referral placed in chart. Tried calling pt to advise. No answer and no voice mail

## 2020-09-12 NOTE — Addendum Note (Signed)
Addended by: Barbette Or on: 09/12/2020 01:55 PM   Modules accepted: Orders

## 2020-09-12 NOTE — Telephone Encounter (Signed)
Tried calling pt to inform of blood work results and referral to rheumatology.  No answer and no voice mail

## 2020-09-15 ENCOUNTER — Telehealth: Payer: Self-pay

## 2020-09-15 NOTE — Progress Notes (Signed)
Called pt and informed. She stated understanding  

## 2020-09-15 NOTE — Telephone Encounter (Signed)
Pt was informed of lab results and of the referral to rheumatology per Dr. Roda Shutters. Pt stated understanding
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
## 2020-11-21 ENCOUNTER — Ambulatory Visit (INDEPENDENT_AMBULATORY_CARE_PROVIDER_SITE_OTHER): Payer: 59 | Admitting: Internal Medicine

## 2020-11-21 ENCOUNTER — Encounter (INDEPENDENT_AMBULATORY_CARE_PROVIDER_SITE_OTHER): Payer: Self-pay

## 2020-11-21 ENCOUNTER — Encounter: Payer: Self-pay | Admitting: Internal Medicine

## 2020-11-21 ENCOUNTER — Other Ambulatory Visit: Payer: Self-pay

## 2020-11-21 VITALS — BP 123/78 | HR 83 | Ht 66.75 in | Wt 222.0 lb

## 2020-11-21 DIAGNOSIS — M79642 Pain in left hand: Secondary | ICD-10-CM | POA: Diagnosis not present

## 2020-11-21 DIAGNOSIS — R768 Other specified abnormal immunological findings in serum: Secondary | ICD-10-CM | POA: Diagnosis not present

## 2020-11-21 DIAGNOSIS — M79641 Pain in right hand: Secondary | ICD-10-CM | POA: Diagnosis not present

## 2020-11-21 DIAGNOSIS — R7689 Other specified abnormal immunological findings in serum: Secondary | ICD-10-CM | POA: Insufficient documentation

## 2020-11-21 NOTE — Progress Notes (Signed)
Office Visit Note  Patient: Ellen Nelson             Date of Birth: 01/10/1956           MRN: [id number]             PCP: Charolette Forward, MD Referring: Leandrew Koyanagi, MD Visit Date: 11/21/2020   Subjective:  New Patient (Initial Visit) (Patient complains of bilateral hand pain, stiffness, and swelling (left>right). Patient has a torn rotator cuff (right). )   History of Present Illness: Ellen Nelson is a 65 y.o. female here for evaluation of bilateral hand pain and swelling with positive ANA. She saw Dr. Erlinda Hong with evaluation not suggestive for carpal tunnel syndrome as a cause of hand symptoms. She did have COVID infection in January treated with infusion.  She has noticed symptoms with hand pain and intermittent swelling and numbness of several fingers this wakes her up in the early morning occasionally but also occurs throughout the day.  Typically symptoms will improve after a period of time.  She notices swelling around the MCP joints of her left hand and occasional numbness affecting the tips of the second through fourth digits.  She has tried some medicines for this without a great improvement including taking a short course of prednisone was only partially beneficial.  She does not typically notice any redness around the joints but she has noticed redness and blistering type skin changes around the tips of these affected fingers.  She has suffered from right rotator cuff tear with decreased use of her right side so the left hand has had more use related to this.  She does notice these hand symptoms have been significantly increased after her illness with COVID but does think they started before this.  She has had previous nerve conduction testing that was not suggestive of carpal tunnel syndrome.  X-ray showed no significant arthritis changes lab work-up only significant for low positive ANA. She denies any symptoms of skin rashes, alopecia, oral ulcers, or Raynaud's symptoms.  Labs  reviewed 08/2020 ANA 1:80 Dense fine speckled RF neg ESR 25 Uric acid 4.4  Imaging reviewed 08/2020 Xray bilateral hands No erosive changes or demineralization seen  Activities of Daily Living:  Patient reports morning stiffness for several hours.   Patient Reports nocturnal pain.  Difficulty dressing/grooming: Reports Difficulty climbing stairs: Denies Difficulty getting out of chair: Reports Difficulty using hands for taps, buttons, cutlery, and/or writing: Reports  Review of Systems  Constitutional:  Positive for fatigue.  HENT:  Negative for mouth sores, mouth dryness and nose dryness.   Eyes:  Positive for visual disturbance and dryness. Negative for pain and itching.  Respiratory:  Negative for cough, hemoptysis, shortness of breath and difficulty breathing.   Cardiovascular:  Positive for swelling in legs/feet. Negative for chest pain and palpitations.  Gastrointestinal:  Positive for constipation and diarrhea. Negative for abdominal pain and blood in stool.  Endocrine: Negative for increased urination.  Genitourinary:  Negative for painful urination.  Musculoskeletal:  Positive for joint pain, joint pain, joint swelling and morning stiffness. Negative for myalgias, muscle weakness, muscle tenderness and myalgias.  Skin:  Positive for color change. Negative for rash and redness.  Allergic/Immunologic: Negative for susceptible to infections.  Neurological:  Positive for dizziness and headaches. Negative for numbness, memory loss and weakness.  Hematological:  Negative for swollen glands.  Psychiatric/Behavioral:  Positive for sleep disturbance. Negative for confusion.    PMFS History:  Patient Active  Problem List   Diagnosis Date Noted   Bilateral hand pain 11/21/2020   Positive ANA (antinuclear antibody) 11/21/2020   Nonspecific syndrome suggestive of viral illness 05/31/2020   Biceps tendinopathy of right upper extremity 12/04/2019   Complete tear of right rotator  cuff 09/05/2019   Impingement syndrome of right shoulder 09/05/2019    Past Medical History:  Diagnosis Date   Allergy    Hypertension    Mitral prolapse    Rotator cuff tear    Right shoulder    Family History  Problem Relation Age of Onset   Hypertension Mother    Hiatal hernia Mother    Heart disease Mother    Dementia Father    Hypertension Father    Cancer - Colon Father    Osteoarthritis Sister    Breast cancer Sister    Heart disease Brother    Early death Brother    Past Surgical History:  Procedure Laterality Date   ABDOMINAL HYSTERECTOMY     TONSILLECTOMY     Social History   Social History Narrative   Not on file    There is no immunization history on file for this patient.   Objective: Vital Signs: BP 123/78 (BP Location: Right Arm, Patient Position: Sitting, Cuff Size: Normal)   Pulse 83   Ht 5' 6.75" (1.695 m)   Wt 222 lb (100.7 kg)   BMI 35.03 kg/m    Physical Exam HENT:     Mouth/Throat:     Mouth: Mucous membranes are moist.     Pharynx: Oropharynx is clear.  Eyes:     Conjunctiva/sclera: Conjunctivae normal.  Cardiovascular:     Rate and Rhythm: Normal rate and regular rhythm.  Pulmonary:     Effort: Pulmonary effort is normal.     Breath sounds: Normal breath sounds.  Skin:    General: Skin is warm and dry.     Findings: No rash.  Neurological:     Deep Tendon Reflexes: Reflexes normal.     Comments: Describes tingling burning discomfort in left third and fourth fingertips  Psychiatric:        Mood and Affect: Mood normal.     Musculoskeletal Exam:  Neck full ROM no tenderness Shoulders right side reduced abduction and external rotation ROM with tenderness Elbows full ROM no tenderness or swelling Wrists full ROM no tenderness or swelling Fingers full ROM no tenderness or swelling Knees full ROM no tenderness or swelling Ankles full ROM no tenderness or swelling   Investigation: No additional findings.  Imaging: No  results found.  Recent Labs: Lab Results  Component Value Date   WBC 8.9 01/15/2018   HGB 12.3 01/15/2018   PLT 215 01/15/2018   NA 140 01/15/2018   K 4.3 01/15/2018   CL 101 01/15/2018   CO2 33 (H) 01/15/2018   GLUCOSE 95 01/15/2018   BUN 15 01/15/2018   CREATININE 0.89 01/15/2018   BILITOT 0.4 07/26/2012   ALKPHOS 110 07/26/2012   AST 27 07/26/2012   ALT 18 07/26/2012   PROT 7.4 07/26/2012   ALBUMIN 4.1 07/26/2012   CALCIUM 8.8 (L) 01/15/2018   GFRAA >60 01/15/2018    Speciality Comments: No specialty comments available.  Procedures:  No procedures performed Allergies: Sulfa antibiotics   Assessment / Plan:     Visit Diagnoses: Positive ANA (antinuclear antibody) - Plan: RNP Antibody, Anti-Smith antibody, Sjogrens syndrome-A extractable nuclear antibody, Sjogrens syndrome-B extractable nuclear antibody, Anti-DNA antibody, double-stranded, Sedimentation rate  Positive ANA  test  No specific clinical criteria noted on physical exam today.  She does describe swelling pain at rest involving proximal finger joints suggestive for inflammatory arthritis.  We will check specific ENA antibody panel.  Clinical history is also somewhat suggestive for a possible reactive process or post-COVID inflammatory process would be suspicious of low positive antibody titers.  Bilateral hand pain  Swelling and pain at rest suggestive for inflammation although no significant morning stiffness and burning or paresthesia type symptoms at the fingertips question neuropathic or vasculitic problem contributing although no CTS evidence.  Orders: Orders Placed This Encounter  Procedures   RNP Antibody   Anti-Smith antibody   Sjogrens syndrome-A extractable nuclear antibody   Sjogrens syndrome-B extractable nuclear antibody   Anti-DNA antibody, double-stranded   Sedimentation rate    No orders of the defined types were placed in this encounter.    Follow-Up Instructions: No follow-ups on  file.   Collier Salina, MD  Note - This record has been created using [employer].  Chart creation errors have been sought, but may not always  have been located. Such creation errors do not reflect on  the standard of medical care.

## 2020-11-25 LAB — SJOGRENS SYNDROME-A EXTRACTABLE NUCLEAR ANTIBODY: SSA (Ro) (ENA) Antibody, IgG: <1 AI

## 2020-11-25 LAB — SJOGRENS SYNDROME-B EXTRACTABLE NUCLEAR ANTIBODY: SSB (La) (ENA) Antibody, IgG: <1 AI

## 2020-11-25 LAB — ANTI-DNA ANTIBODY, DOUBLE-STRANDED: ds DNA Ab: 8 IU/mL — ABNORMAL HIGH

## 2020-11-25 LAB — ANTI-SMITH ANTIBODY: ENA SM Ab Ser-aCnc: <1 AI

## 2020-11-25 LAB — SEDIMENTATION RATE: Sed Rate: 31 mm/h — ABNORMAL HIGH (ref 0–30)

## 2020-11-25 LAB — RNP ANTIBODY: Ribonucleic Protein(ENA) Antibody, IgG: <1 AI

## 2020-12-03 NOTE — Progress Notes (Signed)
Lab tests were borderline positive for sedimentation rate and dsDNA antibodies. I would not consider this strongly suggestive for lupus or a specific inflammatory syndrome overall, but we can follow up to recheck this borderline test in about 2 months also to see if symptoms change or get more specific.

## 2021-04-28 ENCOUNTER — Other Ambulatory Visit: Payer: Self-pay

## 2021-04-28 ENCOUNTER — Ambulatory Visit
Admission: EM | Admit: 2021-04-28 | Discharge: 2021-04-28 | Disposition: A | Discharge status: Home / Self Care | Payer: 59 | Attending: Physician Assistant | Admitting: Physician Assistant

## 2021-04-28 DIAGNOSIS — J209 Acute bronchitis, unspecified: Secondary | ICD-10-CM

## 2021-04-28 MED ORDER — PREDNISONE 20 MG PO TABS: 40.0000 mg | ORAL_TABLET | Freq: Every day | ORAL | 0 refills | Status: AC

## 2021-04-28 MED ORDER — DOXYCYCLINE HYCLATE 100 MG PO CAPS: 100.0000 mg | ORAL_CAPSULE | Freq: Two times a day (BID) | ORAL | 0 refills | Status: DC

## 2021-04-28 NOTE — ED Provider Notes (Signed)
[facility]    CSN: [id number] Arrival date & time: 04/28/21  8182      History   Chief Complaint Chief Complaint  Patient presents with   Cough    HPI Ellen Nelson is a 65 y.o. female.   Patient here today for evaluation of congestion, cough and hoarseness she has had the last week.  She has not had fever recently.  She reports cough is productive at times.  She reports mucus has turned a grayish color.  She denies any nausea, vomiting or diarrhea.  Has tried over-the-counter medication without significant relief.  The history is provided by the patient.  Cough Associated symptoms: no chills, no ear pain, no eye discharge, no fever, no shortness of breath and no sore throat    Past Medical History:  Diagnosis Date   Allergy    Hypertension    Mitral prolapse    Rotator cuff tear    Right shoulder    Patient Active Problem List   Diagnosis Date Noted   Bilateral hand pain 11/21/2020   Positive ANA (antinuclear antibody) 11/21/2020   Nonspecific syndrome suggestive of viral illness 05/31/2020   Biceps tendinopathy of right upper extremity 12/04/2019   Complete tear of right rotator cuff 09/05/2019   Impingement syndrome of right shoulder 09/05/2019    Past Surgical History:  Procedure Laterality Date   ABDOMINAL HYSTERECTOMY     TONSILLECTOMY      OB History   No obstetric history on file.      Home Medications    Prior to Admission medications   Medication Sig Start Date End Date Taking? Authorizing Provider  doxycycline (VIBRAMYCIN) 100 MG capsule Take 1 capsule (100 mg total) by mouth 2 (two) times daily. 04/28/21  Yes Tomi Bamberger, PA-C  predniSONE (DELTASONE) 20 MG tablet Take 2 tablets (40 mg total) by mouth daily with breakfast for 5 days. 04/28/21 05/03/21 Yes Tomi Bamberger, PA-C  cholecalciferol (VITAMIN D) 1000 units tablet Take 2,000 Units by mouth daily.     [provider]  fexofenadine (ALLEGRA) 60 MG tablet  Take 60 mg by mouth daily.     [provider]  fluticasone (FLONASE) 50 MCG/ACT nasal spray Place 2 sprays into both nostrils daily. 05/15/18   Eustace Moore, MD  losartan (COZAAR) 100 MG tablet Take 100 mg by mouth daily.    [provider]  methocarbamol (ROBAXIN) 500 MG tablet Take 1 tablet (500 mg total) by mouth 2 (two) times daily as needed. 12/07/19   Cristie Hem, PA-C  Multiple Vitamins-Minerals (HAIR SKIN AND NAILS FORMULA PO) Take 1 tablet by mouth daily.    [provider]  polycarbophil (FIBERCON) 625 MG tablet Take 1,250 mg by mouth 2 (two) times daily.    [provider]  traMADol (ULTRAM) 50 MG tablet Take 1-2 tablets (50-100 mg total) by mouth 2 (two) times daily as needed. 12/07/19   Cristie Hem, PA-C  vitamin B-12 (CYANOCOBALAMIN) 100 MCG tablet Take 100 mcg by mouth daily.    [provider]  vitamin C (ASCORBIC ACID) 500 MG tablet Take 500 mg by mouth daily.    [provider]    Family History Family History  Problem Relation Age of Onset   Hypertension Mother    Hiatal hernia Mother    Heart disease Mother    Dementia Father    Hypertension Father    Cancer - Colon Father    Osteoarthritis  Sister    Breast cancer Sister    Heart disease Brother    Early death Brother     Social History Social History   Tobacco Use   Smoking status: Former    Types: Cigarettes    Quit date: 2008    Years since quitting: 14.9   Smokeless tobacco: Never  Vaping Use   Vaping Use: Never used  Substance Use Topics   Alcohol use: Yes    Comment: seldom   Drug use: Never     Allergies   Sulfa antibiotics   Review of Systems Review of Systems  Constitutional:  Negative for chills and fever.  HENT:  Positive for congestion. Negative for ear pain and sore throat.   Eyes:  Negative for discharge and redness.  Respiratory:  Positive for cough. Negative for shortness of breath.   Gastrointestinal:   Negative for diarrhea, nausea and vomiting.    Physical Exam Triage Vital Signs ED Triage Vitals  Enc Vitals Group     BP 04/28/21 0937 (!) 163/80     Pulse Rate 04/28/21 0937 68     Resp 04/28/21 0937 18     Temp 04/28/21 0937 97.9 F (36.6 C)     Temp Source 04/28/21 0937 Oral     SpO2 04/28/21 0937 94 %     Weight --      Height --      Head Circumference --      Peak Flow --      Pain Score 04/28/21 0939 0     Pain Loc --      Pain Edu? --      Excl. in GC? --    No data found.  Updated Vital Signs BP (!) 163/80 (BP Location: Left Arm)   Pulse 68   Temp 97.9 F (36.6 C) (Oral)   Resp 18   SpO2 94%   Physical Exam Vitals and nursing note reviewed.  Constitutional:      General: She is not in acute distress.    Appearance: Normal appearance. She is not ill-appearing.  HENT:     Head: Normocephalic and atraumatic.     Nose: Congestion present.     Mouth/Throat:     Mouth: Mucous membranes are moist.  Eyes:     Conjunctiva/sclera: Conjunctivae normal.  Cardiovascular:     Rate and Rhythm: Normal rate and regular rhythm.     Heart sounds: Normal heart sounds. No murmur heard. Pulmonary:     Effort: Pulmonary effort is normal. No respiratory distress.     Breath sounds: Wheezing (mild scattered) present. No rhonchi or rales.     Comments: Deep breathing produces cough Skin:    General: Skin is warm and dry.  Neurological:     Mental Status: She is alert.  Psychiatric:        Mood and Affect: Mood normal.        Thought Content: Thought content normal.     UC Treatments / Results  Labs (all labs ordered are listed, but only abnormal results are displayed) Labs Reviewed - No data to display  EKG   Radiology No results found.  Procedures Procedures (including critical care time)  Medications Ordered in UC Medications - No data to display  Initial Impression / Assessment and Plan / UC Course  I have reviewed the triage vital signs and the  nursing notes.  Pertinent labs & imaging results that were available during my care of the patient  were reviewed by me and considered in my medical decision making (see chart for details).  Suspect bronchitis and will treat with prednisone however given changes in mucus we will also prescribe antibiotic to cover possible bacterial etiology/ sinusitis.  Recommend follow-up if symptoms fail to improve or worsen.  Final Clinical Impressions(s) / UC Diagnoses   Final diagnoses:  Acute bronchitis, unspecified organism   Discharge Instructions   None    ED Prescriptions     Medication Sig Dispense Auth. Provider   doxycycline (VIBRAMYCIN) 100 MG capsule Take 1 capsule (100 mg total) by mouth 2 (two) times daily. 20 capsule Erma Pinto F, PA-C   predniSONE (DELTASONE) 20 MG tablet Take 2 tablets (40 mg total) by mouth daily with breakfast for 5 days. 10 tablet Tomi Bamberger, PA-C      PDMP not reviewed this encounter.   Tomi Bamberger, PA-C 04/28/21 1041

## 2021-04-28 NOTE — ED Triage Notes (Signed)
One week cough, congestion, intermittent HA and hoarseness. Has been dayquil with minimal relief. No v/d or body aches.

## 2021-06-04 ENCOUNTER — Ambulatory Visit: Payer: Medicare HMO | Admitting: Podiatry

## 2021-06-15 ENCOUNTER — Ambulatory Visit: Payer: Medicare HMO | Admitting: Podiatry

## 2021-06-15 DIAGNOSIS — H2513 Age-related nuclear cataract, bilateral: Secondary | ICD-10-CM | POA: Diagnosis not present

## 2021-06-15 DIAGNOSIS — Z0100 Encounter for examination of eyes and vision without abnormal findings: Secondary | ICD-10-CM | POA: Diagnosis not present

## 2021-06-15 DIAGNOSIS — H04123 Dry eye syndrome of bilateral lacrimal glands: Secondary | ICD-10-CM | POA: Diagnosis not present

## 2021-07-28 ENCOUNTER — Ambulatory Visit: Payer: Medicare HMO | Admitting: Orthopaedic Surgery

## 2021-07-28 ENCOUNTER — Other Ambulatory Visit: Payer: Self-pay

## 2021-07-28 ENCOUNTER — Ambulatory Visit (INDEPENDENT_AMBULATORY_CARE_PROVIDER_SITE_OTHER): Payer: Medicare HMO

## 2021-07-28 ENCOUNTER — Encounter: Payer: Self-pay | Admitting: Orthopaedic Surgery

## 2021-07-28 DIAGNOSIS — M25512 Pain in left shoulder: Secondary | ICD-10-CM

## 2021-07-28 DIAGNOSIS — M329 Systemic lupus erythematosus, unspecified: Secondary | ICD-10-CM

## 2021-07-28 DIAGNOSIS — G8929 Other chronic pain: Secondary | ICD-10-CM

## 2021-07-28 DIAGNOSIS — M25511 Pain in right shoulder: Secondary | ICD-10-CM | POA: Diagnosis not present

## 2021-07-28 MED ORDER — TRAMADOL HCL 50 MG PO TABS
50.0000 mg | ORAL_TABLET | Freq: Two times a day (BID) | ORAL | 2 refills | Status: DC | PRN
Start: 2021-07-28 — End: 2022-02-16
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
## 2021-07-29 ENCOUNTER — Other Ambulatory Visit: Payer: Self-pay

## 2021-07-29 DIAGNOSIS — G8929 Other chronic pain: Secondary | ICD-10-CM

## 2021-07-29 DIAGNOSIS — M25511 Pain in right shoulder: Secondary | ICD-10-CM

## 2021-07-29 DIAGNOSIS — M25512 Pain in left shoulder: Secondary | ICD-10-CM

## 2021-08-18 ENCOUNTER — Encounter: Payer: Self-pay | Admitting: *Deleted

## 2021-08-25 ENCOUNTER — Ambulatory Visit (INDEPENDENT_AMBULATORY_CARE_PROVIDER_SITE_OTHER): Payer: Medicare HMO

## 2021-08-25 ENCOUNTER — Encounter: Payer: Self-pay | Admitting: Orthopaedic Surgery

## 2021-08-25 ENCOUNTER — Ambulatory Visit: Payer: Medicare HMO | Admitting: Orthopaedic Surgery

## 2021-08-25 DIAGNOSIS — I2510 Atherosclerotic heart disease of native coronary artery without angina pectoris: Secondary | ICD-10-CM | POA: Diagnosis not present

## 2021-08-25 DIAGNOSIS — I10 Essential (primary) hypertension: Secondary | ICD-10-CM | POA: Diagnosis not present

## 2021-08-25 DIAGNOSIS — M1611 Unilateral primary osteoarthritis, right hip: Secondary | ICD-10-CM

## 2021-08-25 DIAGNOSIS — J45909 Unspecified asthma, uncomplicated: Secondary | ICD-10-CM | POA: Diagnosis not present

## 2021-08-25 DIAGNOSIS — E785 Hyperlipidemia, unspecified: Secondary | ICD-10-CM | POA: Diagnosis not present

## 2021-08-25 NOTE — Progress Notes (Signed)
? ?Office Visit Note ?  ?Patient: Ellen Nelson           ?Date of Birth: 02/26/1956           ?MRN: [id number] ?Visit Date: 08/25/2021 ?             ?Requested by: Rinaldo Cloud, MD ?[address] ?[address] ?[place],  [place] [address] ?PCP: Rinaldo Cloud, MD ? ? ?Assessment & Plan: ?Visit Diagnoses:  ?1. Primary osteoarthritis of right hip   ? ? ?Plan: Impression is advanced degenerative joint disease right hip.  We had a discussion on repeat cortisone injection versus total hip arthroplasty.  She does need to figure out timing of surgery so she would like to first proceed with injection.  Referral has been made to Dr. Alvester Morin.  She will follow-up with [place] to further discuss hip replacement in the near future.  Call with concerns or questions. ? ?Follow-Up Instructions: Return if symptoms worsen or fail to improve.  ? ?Orders:  ?Orders Placed This Encounter  ?Procedures  ? XR HIP UNILAT W OR W/O PELVIS 2-3 VIEWS RIGHT  ? ?No orders of the defined types were placed in this encounter. ? ? ? ? Procedures: ?No procedures performed ? ? ?Clinical Data: ?No additional findings. ? ? ?Subjective: ?Chief Complaint  ?Patient presents with  ? Right Hip - Pain  ? ? ?HPI patient is a pleasant 66 year old female who comes in today with right hip pain for the past several months.  Pain is worse when working as well as the end of the day.  Her pain is located to the groin and anterior thigh.  Pain is worse with any twisting motions.  She has been taking Tylenol arthritis without significant relief.  She notes that she underwent right hip cortisone injection years ago which did provide some relief. ? ?Review of Systems as detailed in HPI.  All others reviewed and are negative. ? ? ?Objective: ?Vital Signs: There were no vitals taken for this visit. ? ?Physical Exam well-developed well-nourished female no acute distress.  Alert and oriented x3. ? ?Ortho Exam examination of the right hip reveals a markedly positive logroll and  FADIR.  She is neurovascular intact distally. ? ?Specialty Comments:  ?No specialty comments available. ? ?Imaging: ?XR HIP UNILAT W OR W/O PELVIS 2-3 VIEWS RIGHT ? ?Result Date: 08/25/2021 ?X-rays demonstrate advanced degenerative changes to the right hip joint  ? ? ?PMFS History: ?Patient Active Problem List  ? Diagnosis Date Noted  ? Bilateral hand pain 11/21/2020  ? Positive ANA (antinuclear antibody) 11/21/2020  ? Nonspecific syndrome suggestive of viral illness 05/31/2020  ? Biceps tendinopathy of right upper extremity 12/04/2019  ? Complete tear of right rotator cuff 09/05/2019  ? Impingement syndrome of right shoulder 09/05/2019  ? ?Past Medical History:  ?Diagnosis Date  ? Allergy   ? Hypertension   ? Mitral prolapse   ? Rotator cuff tear   ? Right shoulder  ?  ?Family History  ?Problem Relation Age of Onset  ? Hypertension Mother   ? Hiatal hernia Mother   ? Heart disease Mother   ? Dementia Father   ? Hypertension Father   ? Cancer - Colon Father   ? Osteoarthritis Sister   ? Breast cancer Sister   ? Heart disease Brother   ? Early death Brother   ?  ?Past Surgical History:  ?Procedure Laterality Date  ? ABDOMINAL HYSTERECTOMY    ? TONSILLECTOMY    ? ?  Social History  ? ?Occupational History  ? Not on file  ?Tobacco Use  ? Smoking status: Former  ?  Types: Cigarettes  ?  Quit date: 2008  ?  Years since quitting: 15.2  ? Smokeless tobacco: Never  ?Vaping Use  ? Vaping Use: Never used  ?Substance and Sexual Activity  ? Alcohol use: Yes  ?  Comment: seldom  ? Drug use: Never  ? Sexual activity: Not on file  ? ? ? ? ? ? ?

## 2021-08-27 ENCOUNTER — Other Ambulatory Visit: Payer: Self-pay

## 2021-08-27 DIAGNOSIS — M1611 Unilateral primary osteoarthritis, right hip: Secondary | ICD-10-CM

## 2021-09-30 ENCOUNTER — Ambulatory Visit: Payer: Self-pay

## 2021-09-30 ENCOUNTER — Ambulatory Visit: Payer: Medicare HMO | Admitting: Physical Medicine and Rehabilitation

## 2021-09-30 ENCOUNTER — Encounter: Payer: Self-pay | Admitting: Physical Medicine and Rehabilitation

## 2021-09-30 DIAGNOSIS — M25551 Pain in right hip: Secondary | ICD-10-CM

## 2021-09-30 NOTE — Progress Notes (Signed)
.  Numeric Pain Rating Scale and Functional Assessment Average Pain 7   In the last MONTH (on 0-10 scale) has pain interfered with the following?  1. General activity like being  able to carry out your everyday physical activities such as walking, climbing stairs, carrying groceries, or moving a chair?  Rating(7)   +Driver, -BT, -Dye Allergies.   

## 2021-09-30 NOTE — Progress Notes (Signed)
   Ellen Nelson - 66 y.o. female MRN [id number]  Date of birth: 08-Nov-1955  Office Visit Note: Visit Date: 09/30/2021 PCP: Rinaldo Cloud, MD Referred by: Rinaldo Cloud, MD  Subjective: Chief Complaint  Patient presents with   Right Hip - Pain   HPI:  Ellen Nelson is a 66 y.o. female who comes in today at the request of Dr. Glee Arvin for planned Right anesthetic hip arthrogram with fluoroscopic guidance.  The patient has failed conservative care including home exercise, medications, time and activity modification.  This injection will be diagnostic and hopefully therapeutic.  Please see requesting physician notes for further details and justification.  ROS Otherwise per HPI.  Assessment & Plan: Visit Diagnoses:    ICD-10-CM   1. Pain in right hip  M25.551 Large Joint Inj: R hip joint    XR C-ARM NO REPORT      Plan: No additional findings.   Meds & Orders: No orders of the defined types were placed in this encounter.   Orders Placed This Encounter  Procedures   Large Joint Inj: R hip joint   XR C-ARM NO REPORT    Follow-up: Return if symptoms worsen or fail to improve.   Procedures: Large Joint Inj: R hip joint on 09/30/2021 1:14 PM Indications: diagnostic evaluation and pain Details: 22 G 3.5 in needle, fluoroscopy-guided anterior approach  Arthrogram: No  Medications: 4 mL bupivacaine 0.25 %; 60 mg triamcinolone acetonide 40 MG/ML Outcome: tolerated well, no immediate complications  There was excellent flow of contrast producing a partial arthrogram of the hip. The patient did have relief of symptoms during the anesthetic phase of the injection. Procedure, treatment alternatives, risks and benefits explained, specific risks discussed. Consent was given by the patient. Immediately prior to procedure a time out was called to verify the correct patient, procedure, equipment, support staff and site/side marked as required. Patient was prepped and draped in the usual  sterile fashion.         Clinical History: No specialty comments available.     Objective:  VS:  HT:    WT:   BMI:     BP:   HR: bpm  TEMP: ( )  RESP:  Physical Exam   Imaging: No results found.

## 2021-10-15 ENCOUNTER — Ambulatory Visit: Payer: Medicare HMO | Admitting: Podiatry

## 2021-10-15 DIAGNOSIS — B351 Tinea unguium: Secondary | ICD-10-CM | POA: Diagnosis not present

## 2021-10-15 MED ORDER — TERBINAFINE HCL 250 MG PO TABS: 250.0000 mg | ORAL_TABLET | Freq: Every day | ORAL | 0 refills | Status: AC

## 2021-10-15 MED ORDER — CICLOPIROX 8 % EX SOLN
Freq: Every day | CUTANEOUS | 0 refills | Status: AC
Start: 2021-10-15 — End: ?

## 2021-10-15 NOTE — Progress Notes (Signed)
  Subjective:  Patient ID: Ellen Nelson, female    DOB: August 01, 1955,  MRN: [id number]  Chief Complaint  Patient presents with   Nail Problem      (np) bil great toe, ingrown/fungus    66 y.o. female presents with the above complaint. History confirmed with patient.  Been there for some time and worsening over the last few months it is very brittle and she got a pedicure recently and was hard to cut  Objective:  Physical Exam: warm, good capillary refill, no trophic changes or ulcerative lesions, normal DP and PT pulses, normal sensory exam, and bilateral hallux nail onychomycosis involving 40% of nail plate.     Assessment:   1. Onychomycosis      Plan:  Patient was evaluated and treated and all questions answered.  Discussed etiology and treatment options of onychomycosis including topical oral and laser therapy.  She has no history of liver disease and takes no medications that would preclude her to oral therapy.  Recommended dual therapy with ciclopirox and the Lamisil.  Rx sent to pharmacy for both.  We also discussed the options of nail removal temporarily or laser therapy if it does not improve.  Photographs were taken.  I will see her back in 4 months.  Return in about 4 months (around 02/15/2022) for follow up after nail fungus treatment.

## 2021-10-18 MED ORDER — BUPIVACAINE HCL 0.25 % IJ SOLN
4.0000 mL | INTRAMUSCULAR | Status: AC | PRN
  Administered 2021-09-30: 4 mL via INTRA_ARTICULAR

## 2021-10-18 MED ORDER — TRIAMCINOLONE ACETONIDE 40 MG/ML IJ SUSP
60.0000 mg | INTRAMUSCULAR | Status: AC | PRN
  Administered 2021-09-30: 60 mg via INTRA_ARTICULAR

## 2021-12-04 DIAGNOSIS — R200 Anesthesia of skin: Secondary | ICD-10-CM | POA: Diagnosis not present

## 2021-12-04 DIAGNOSIS — Z6834 Body mass index (BMI) 34.0-34.9, adult: Secondary | ICD-10-CM | POA: Diagnosis not present

## 2021-12-04 DIAGNOSIS — E559 Vitamin D deficiency, unspecified: Secondary | ICD-10-CM | POA: Diagnosis not present

## 2021-12-04 DIAGNOSIS — Z79899 Other long term (current) drug therapy: Secondary | ICD-10-CM | POA: Diagnosis not present

## 2021-12-04 DIAGNOSIS — E669 Obesity, unspecified: Secondary | ICD-10-CM | POA: Diagnosis not present

## 2021-12-15 DIAGNOSIS — Z1231 Encounter for screening mammogram for malignant neoplasm of breast: Secondary | ICD-10-CM | POA: Diagnosis not present

## 2022-01-05 DIAGNOSIS — E785 Hyperlipidemia, unspecified: Secondary | ICD-10-CM | POA: Diagnosis not present

## 2022-01-05 DIAGNOSIS — I2510 Atherosclerotic heart disease of native coronary artery without angina pectoris: Secondary | ICD-10-CM | POA: Diagnosis not present

## 2022-01-05 DIAGNOSIS — I10 Essential (primary) hypertension: Secondary | ICD-10-CM | POA: Diagnosis not present

## 2022-01-12 ENCOUNTER — Ambulatory Visit (INDEPENDENT_AMBULATORY_CARE_PROVIDER_SITE_OTHER): Payer: Medicare HMO

## 2022-01-12 ENCOUNTER — Ambulatory Visit: Payer: Medicare HMO | Admitting: Orthopaedic Surgery

## 2022-01-12 ENCOUNTER — Encounter: Payer: Self-pay | Admitting: Orthopaedic Surgery

## 2022-01-12 DIAGNOSIS — M1611 Unilateral primary osteoarthritis, right hip: Secondary | ICD-10-CM | POA: Insufficient documentation

## 2022-01-12 NOTE — Progress Notes (Signed)
Office Visit Note   Patient: Ellen Nelson           Date of Birth: [birth year]           MRN: [id number] Visit Date: 01/12/2022              Requested by: Charolette Forward, MD [phone] [address] [place],  [place] [address] PCP: Charolette Forward, MD   Assessment & Plan: Visit Diagnoses:  1. Primary osteoarthritis of right hip     Plan: Impression is end-stage right hip DJD.  She has bone-on-bone joint space narrowing.  Conservative management no longer provides any relief.  Based on her options she has elected for more a right total hip replacement.  Risk benefits prognosis reviewed with the patient in detail.  Questions encouraged and answered.  She met with Jackelyn Poling today.  We will get preoperative cardiac clearance from Dr. Terrence Dupont.  Follow-Up Instructions: No follow-ups on file.   Orders:  Orders Placed This Encounter  Procedures   XR HIP UNILAT W OR W/O PELVIS 2-3 VIEWS RIGHT   No orders of the defined types were placed in this encounter.     Procedures: No procedures performed   Clinical Data: No additional findings.   Subjective: Chief Complaint  Patient presents with   Right Hip - Pain    HPI Ellen Nelson is a 66 year old female who is well-known to me.  I treated her for the last couple years for right hip DJD.  We have tried to manage this with medications and cortisone injections.  She did undergo cortisone injection about a little over 3 months ago which gave her a lot of relief until about 2 weeks ago.  She can barely make it through her shift at work.  Over-the-counter medications no longer help. Review of Systems  Constitutional: Negative.   HENT: Negative.    Eyes: Negative.   Respiratory: Negative.    Cardiovascular: Negative.   Endocrine: Negative.   Musculoskeletal: Negative.   Neurological: Negative.   Hematological: Negative.   Psychiatric/Behavioral: Negative.    All other systems reviewed and are negative.    Objective: Vital  Signs: There were no vitals taken for this visit.  Physical Exam Vitals and nursing note reviewed.  Constitutional:      Appearance: She is well-developed.  HENT:     Head: Atraumatic.     Nose: Nose normal.  Eyes:     Extraocular Movements: Extraocular movements intact.  Cardiovascular:     Pulses: Normal pulses.  Pulmonary:     Effort: Pulmonary effort is normal.  Abdominal:     Palpations: Abdomen is soft.  Musculoskeletal:     Cervical back: Neck supple.  Skin:    General: Skin is warm.     Capillary Refill: Capillary refill takes less than 2 seconds.  Neurological:     Mental Status: She is alert. Mental status is at baseline.  Psychiatric:        Behavior: Behavior normal.        Thought Content: Thought content normal.        Judgment: Judgment normal.     Ortho Exam Examination of right hip shows severe pain with hip flexion past 60 degrees and positive FADIR test. Specialty Comments:  No specialty comments available.  Imaging: XR HIP UNILAT W OR W/O PELVIS 2-3 VIEWS RIGHT  Result Date: 01/12/2022 Severe joint space narrowing consistent with advanced DJD.    PMFS History: Patient Active Problem List  Diagnosis Date Noted   Primary osteoarthritis of right hip 01/12/2022   Bilateral hand pain 11/21/2020   Positive ANA (antinuclear antibody) 11/21/2020   Nonspecific syndrome suggestive of viral illness 05/31/2020   Biceps tendinopathy of right upper extremity 12/04/2019   Complete tear of right rotator cuff 09/05/2019   Impingement syndrome of right shoulder 09/05/2019   Past Medical History:  Diagnosis Date   Allergy    Hypertension    Mitral prolapse    Rotator cuff tear    Right shoulder    Family History  Problem Relation Age of Onset   Hypertension Mother    Hiatal hernia Mother    Heart disease Mother    Dementia Father    Hypertension Father    Cancer - Colon Father    Osteoarthritis Sister    Breast cancer Sister    Heart disease  Brother    Early death Brother     Past Surgical History:  Procedure Laterality Date   ABDOMINAL HYSTERECTOMY     TONSILLECTOMY     Social History   Occupational History   Not on file  Tobacco Use   Smoking status: Former    Types: Cigarettes    Quit date: 2008    Years since quitting: 15.6   Smokeless tobacco: Never  Vaping Use   Vaping Use: Never used  Substance and Sexual Activity   Alcohol use: Yes    Comment: seldom   Drug use: Never   Sexual activity: Not on file

## 2022-01-26 ENCOUNTER — Other Ambulatory Visit: Payer: Self-pay

## 2022-02-04 NOTE — Progress Notes (Addendum)
Surgical Instructions    Your procedure is scheduled on 02/15/22.  Report to [facility] Main Entrance "A" at 8:15 A.M., then check in with the Admitting office.  Call this number if you have problems the morning of surgery:  [phone]   If you have any questions prior to your surgery date call [phone]: Open Monday-Friday 8am-4pm    Remember:  Do not eat after midnight the night before your surgery  You may drink clear liquids until 7:15am the morning of your surgery.   Clear liquids allowed are: Water, Non-Citrus Juices (without pulp), Carbonated Beverages, Clear Tea, Black Coffee ONLY (NO MILK, CREAM OR POWDERED CREAMER of any kind), and Gatorade  Patient Instructions  The night before surgery:  No food after midnight. ONLY clear liquids after midnight  The day of surgery (if you do NOT have diabetes):  Drink ONE (1) Pre-Surgery Clear Ensure by 7:15am the morning of surgery. Drink in one sitting. Do not sip.  This drink was given to you during your hospital  pre-op appointment visit. Nothing else to drink after completing the  Pre-Surgery Clear Ensure.           If you have questions, please contact your surgeon's office.     Take these medicines the morning of surgery with A SIP OF WATER:  fexofenadine (ALLEGRA) fluticasone (FLONASE)  IF NEEDED: albuterol (PROVENTIL) (2.5 MG/3ML) nebulizer or inhaler- bring inhalers with you the day of surgery traMADol (ULTRAM)  As of today, STOP taking any Aspirin (unless otherwise instructed by your surgeon) Aleve, Naproxen, Ibuprofen, Motrin, Advil, Goody's, BC's, all herbal medications, fish oil, and all vitamins.           Do not wear jewelry or makeup. Do not wear lotions, powders, perfumes/cologne or deodorant. Do not shave 48 hours prior to surgery.   Do not bring valuables to the hospital. Do not wear nail polish, gel polish, artificial nails, or any other type of covering on natural nails (fingers and toes) If you  have artificial nails or gel coating that need to be removed by a nail salon, please have this removed prior to surgery. Artificial nails or gel coating may interfere with anesthesia's ability to adequately monitor your vital signs.  [facility] is not responsible for any belongings or valuables.    Do NOT Smoke (Tobacco/Vaping)  24 hours prior to your procedure.  If you use a CPAP at night, you may bring your mask for your overnight stay.   Contacts, glasses, hearing aids, dentures or partials may not be worn into surgery, please bring cases for these belongings.   For patients admitted to the hospital, discharge time will be determined by your treatment team.   Patients discharged the day of surgery will not be allowed to drive home, and someone needs to stay with them for 24 hours.   SURGICAL WAITING ROOM VISITATION Patients having surgery or a procedure may have no more than 2 support people in the waiting area - these visitors may rotate.   Children under the age of 10 must have an adult with them who is not the patient. If the patient needs to stay at the hospital during part of their recovery, the visitor guidelines for inpatient rooms apply. Pre-op nurse will coordinate an appropriate time for 1 support person to accompany patient in pre-op.  This support person may not rotate.   Please refer to the [facility] website for the visitor guidelines for Inpatients (after your surgery is over and  you are in a regular room).    Special instructions:    Oral Hygiene is also important to reduce your risk of infection.  Remember - BRUSH YOUR TEETH THE MORNING OF SURGERY WITH YOUR REGULAR TOOTHPASTE   [facility]- Preparing For Surgery  Before surgery, you can play an important role. Because skin is not sterile, your skin needs to be as free of germs as possible. You can reduce the number of germs on your skin by washing with CHG (chlorahexidine gluconate) Soap before surgery.  CHG is an  antiseptic cleaner which kills germs and bonds with the skin to continue killing germs even after washing.     Please do not use if you have an allergy to CHG or antibacterial soaps. If your skin becomes reddened/irritated stop using the CHG.  Do not shave (including legs and underarms) for at least 48 hours prior to first CHG shower. It is OK to shave your face.  Please follow these instructions carefully.     Shower the NIGHT BEFORE SURGERY and the MORNING OF SURGERY with CHG Soap.   If you chose to wash your hair, wash your hair first as usual with your normal shampoo. After you shampoo, rinse your hair and body thoroughly to remove the shampoo.  Then [employer] and genitals (private parts) with your normal soap and rinse thoroughly to remove soap.  After that Use CHG Soap as you would any other liquid soap. You can apply CHG directly to the skin and wash gently with a scrungie or a clean washcloth.   Apply the CHG Soap to your body ONLY FROM THE NECK DOWN.  Do not use on open wounds or open sores. Avoid contact with your eyes, ears, mouth and genitals (private parts). Wash Face and genitals (private parts)  with your normal soap.   Wash thoroughly, paying special attention to the area where your surgery will be performed.  Thoroughly rinse your body with warm water from the neck down.  DO NOT shower/wash with your normal soap after using and rinsing off the CHG Soap.  Pat yourself dry with a CLEAN TOWEL.  Wear CLEAN PAJAMAS to bed the night before surgery  Place CLEAN SHEETS on your bed the night before your surgery  DO NOT SLEEP WITH PETS.   Day of Surgery: Take a shower with CHG soap. Wear Clean/Comfortable clothing the morning of surgery Do not apply any deodorants/lotions.   Remember to brush your teeth WITH YOUR REGULAR TOOTHPASTE.    If you received a COVID test during your pre-op visit, it is requested that you wear a mask when out in public, stay away from anyone  that may not be feeling well, and notify your surgeon if you develop symptoms. If you have been in contact with anyone that has tested positive in the last 10 days, please notify your surgeon.    Please read over the following fact sheets that you were given.

## 2022-02-05 ENCOUNTER — Encounter (HOSPITAL_COMMUNITY): Payer: Self-pay

## 2022-02-05 ENCOUNTER — Other Ambulatory Visit: Payer: Self-pay

## 2022-02-05 ENCOUNTER — Encounter (HOSPITAL_COMMUNITY)
Admission: RE | Admit: 2022-02-05 | Discharge: 2022-02-05 | Disposition: A | Discharge status: Home / Self Care | Payer: Medicare HMO | Source: Ambulatory Visit | Attending: Orthopaedic Surgery | Admitting: Orthopaedic Surgery

## 2022-02-05 VITALS — BP 139/75 | HR 72 | Temp 97.6°F | Resp 17 | Ht 67.0 in | Wt 215.4 lb

## 2022-02-05 DIAGNOSIS — Z01818 Encounter for other preprocedural examination: Secondary | ICD-10-CM

## 2022-02-05 DIAGNOSIS — M1611 Unilateral primary osteoarthritis, right hip: Secondary | ICD-10-CM | POA: Diagnosis not present

## 2022-02-05 HISTORY — DX: Unspecified asthma, uncomplicated: J45.909

## 2022-02-05 HISTORY — DX: Unspecified osteoarthritis, unspecified site: M19.90

## 2022-02-05 LAB — CBC
HCT: 37.7 % (ref 36.0–46.0)
Hemoglobin: 12.6 g/dL (ref 12.0–15.0)
MCH: 28.3 pg (ref 26.0–34.0)
MCHC: 33.4 g/dL (ref 30.0–36.0)
MCV: 84.7 fL (ref 80.0–100.0)
Platelets: 225 10*3/uL (ref 150–400)
RBC: 4.45 MIL/uL (ref 3.87–5.11)
RDW: 13.6 % (ref 11.5–15.5)
WBC: 6.9 10*3/uL (ref 4.0–10.5)
nRBC: 0 % (ref 0.0–0.2)

## 2022-02-05 LAB — TYPE AND SCREEN
ABO/RH(D): O POS
Antibody Screen: NEGATIVE

## 2022-02-05 LAB — BASIC METABOLIC PANEL
Anion gap: 8 (ref 5–15)
BUN: 16 mg/dL (ref 8–23)
CO2: 29 mmol/L (ref 22–32)
Calcium: 9.1 mg/dL (ref 8.9–10.3)
Chloride: 102 mmol/L (ref 98–111)
Creatinine, Ser: 0.8 mg/dL (ref 0.44–1.00)
GFR, Estimated: >60 mL/min (ref >60)
Glucose, Bld: 101 mg/dL — ABNORMAL HIGH (ref 70–99)
Potassium: 3.8 mmol/L (ref 3.5–5.1)
Sodium: 139 mmol/L (ref 135–145)

## 2022-02-05 LAB — SURGICAL PCR SCREEN
MRSA, PCR: NEGATIVE
Staphylococcus aureus: NEGATIVE

## 2022-02-05 NOTE — Progress Notes (Signed)
PCP/Cardiologist - Dr. Rinaldo Cloud  PPM/ICD - n/a Device Orders - n/a Rep Notified - n/a  Chest x-ray - n/a EKG - 02/05/22 Stress Test - Over 15 years ago. Normal per patient.  ECHO - denies Cardiac Cath - denies  Sleep Study - Years ago. Negative for OSA per patient.   Blood Thinner Instructions: n/a Aspirin Instructions: n/a  ERAS Protcol - Clear liquids until 0715 day of surgery PRE-SURGERY Ensure or G2- Ensure  COVID TEST- N/A   Anesthesia review: Yes. EKG Review.   Patient denies shortness of breath, fever, cough and chest pain at PAT appointment   All instructions explained to the patient, with a verbal understanding of the material. Patient agrees to go over the instructions while at home for a better understanding. The opportunity to ask questions was provided.

## 2022-02-08 ENCOUNTER — Other Ambulatory Visit: Payer: Self-pay | Admitting: Physician Assistant

## 2022-02-08 MED ORDER — DOCUSATE SODIUM 100 MG PO CAPS
100.0000 mg | ORAL_CAPSULE | Freq: Every day | ORAL | 2 refills | Status: AC | PRN
Start: 2022-02-08 — End: 2023-02-08

## 2022-02-08 MED ORDER — METHOCARBAMOL 750 MG PO TABS: 750.0000 mg | ORAL_TABLET | Freq: Two times a day (BID) | ORAL | 2 refills | Status: DC | PRN

## 2022-02-08 MED ORDER — ASPIRIN 81 MG PO TBEC: 81.0000 mg | DELAYED_RELEASE_TABLET | Freq: Two times a day (BID) | ORAL | 0 refills | Status: AC

## 2022-02-08 MED ORDER — HYDROCODONE-ACETAMINOPHEN 7.5-325 MG PO TABS: 1.0000 | ORAL_TABLET | Freq: Four times a day (QID) | ORAL | 0 refills | Status: DC | PRN

## 2022-02-08 MED ORDER — ONDANSETRON HCL 4 MG PO TABS: 4.0000 mg | ORAL_TABLET | Freq: Three times a day (TID) | ORAL | 0 refills | Status: DC | PRN

## 2022-02-09 ENCOUNTER — Encounter (HOSPITAL_COMMUNITY): Payer: Self-pay

## 2022-02-09 NOTE — Progress Notes (Signed)
Anesthesia Chart Review:  Patient follows with cardiologist Dr. Terrence Dupont for general medical as well as cardiology management.  Last seen 01/05/2022.  Per note, "doing well.  Denies any chest pain or shortness of breath.  Denies palpitation, lightheadedness or syncope.  Complains of musculoskeletal right hip pain.  States recently had steroid injection with improvement in her symptoms but again now started having pain being followed by orthopedics."  Note also states patient has soft 1 out of 6 systolic murmur.  I called the office and confirmed that there is no echocardiogram on file.  Patient denies history of echocardiogram.  Patient reported remote history of stress test which was reportedly normal.  Preop labs reviewed, WNL.  EKG 02/05/2022: NSR with sinus arrhythmia.  Rate 74.  Septal infarct, age undetermined.  No significant change from prior.   Wynonia Musty [facility] Short Stay Center/Anesthesiology Phone [phone] 02/09/2022 2:56 PM

## 2022-02-09 NOTE — Anesthesia Preprocedure Evaluation (Addendum)
Anesthesia Evaluation  Patient identified by MRN, date of birth, ID band Patient awake    Reviewed: Allergy & Precautions, NPO status , Patient's Chart, lab work & pertinent test results  History of Anesthesia Complications Negative for: history of anesthetic complications  Airway Mallampati: II  TM Distance: >3 FB Neck ROM: Full    Dental  (+) Chipped, Poor Dentition, Missing,    Pulmonary asthma , former smoker,    Pulmonary exam normal        Cardiovascular hypertension, Pt. on medications Normal cardiovascular exam     Neuro/Psych negative neurological ROS  negative psych ROS   GI/Hepatic negative GI ROS, Neg liver ROS,   Endo/Other  negative endocrine ROS  Renal/GU negative Renal ROS  negative genitourinary   Musculoskeletal  (+) Arthritis ,   Abdominal   Peds  Hematology negative hematology ROS (+)   Anesthesia Other Findings Day of surgery medications reviewed with patient.  Reproductive/Obstetrics negative OB ROS                           Anesthesia Physical Anesthesia Plan  ASA: 2  Anesthesia Plan: Spinal   Post-op Pain Management: Tylenol PO (pre-op)*   Induction:   PONV Risk Score and Plan: 3 and Treatment may vary due to age or medical condition, Ondansetron, Propofol infusion, Dexamethasone and Midazolam  Airway Management Planned: Natural Airway and Simple Face Mask  Additional Equipment: None  Intra-op Plan:   Post-operative Plan:   Informed Consent: I have reviewed the patients History and Physical, chart, labs and discussed the procedure including the risks, benefits and alternatives for the proposed anesthesia with the patient or authorized representative who has indicated his/her understanding and acceptance.       Plan Discussed with: CRNA  Anesthesia Plan Comments: (PAT note by Karoline Caldwell, PA-C: Patient follows with cardiologist Dr. Terrence Dupont for  general medical as well as cardiology management.  Last seen 01/05/2022.  Per note, "doing well.  Denies any chest pain or shortness of breath.  Denies palpitation, lightheadedness or syncope.  Complains of musculoskeletal right hip pain.  States recently had steroid injection with improvement in her symptoms but again now started having pain being followed by orthopedics."  Note also states patient has soft 1 out of 6 systolic murmur.  I called the office and confirmed that there is no echocardiogram on file.  Patient denies history of echocardiogram.  Patient reported remote history of stress test which was reportedly normal.  Preop labs reviewed, WNL.  EKG 02/05/2022: NSR with sinus arrhythmia.  Rate 74.  Septal infarct, age undetermined.  No significant change from prior. )      Anesthesia Quick Evaluation

## 2022-02-12 ENCOUNTER — Telehealth: Payer: Self-pay | Admitting: *Deleted

## 2022-02-12 NOTE — Telephone Encounter (Signed)
Ortho bundle Pre-op call completed. 

## 2022-02-12 NOTE — Care Plan (Signed)
[facility] RNCM call to patient to discuss her upcoming Right total hip arthroplasty with Dr. Erlinda Hong on 02/15/22. She is an Ortho bundle patient through [facility] and is agreeable to case management. She lives with family (daughters and Son in [employer] [place]) and has a large extended family that will be assisting after discharge. She will need a RW and 3in1/BSC at discharge. This will be provided at the hospital. Anticipate HHPT will be needed after a short hospital stay. Referral made to [facility] after choice provided. Reviewed all post op care instructions. Will continue to follow for needs.

## 2022-02-15 ENCOUNTER — Ambulatory Visit (HOSPITAL_COMMUNITY): Payer: Medicare HMO | Admitting: Physician Assistant

## 2022-02-15 ENCOUNTER — Other Ambulatory Visit: Payer: Self-pay

## 2022-02-15 ENCOUNTER — Encounter (HOSPITAL_COMMUNITY)
Admission: RE | Disposition: A | Discharge status: Home Health | Payer: Self-pay | Source: Ambulatory Visit | Attending: Orthopaedic Surgery

## 2022-02-15 ENCOUNTER — Observation Stay (HOSPITAL_COMMUNITY): Payer: Medicare HMO

## 2022-02-15 ENCOUNTER — Ambulatory Visit (HOSPITAL_COMMUNITY): Payer: Medicare HMO

## 2022-02-15 ENCOUNTER — Ambulatory Visit (HOSPITAL_BASED_OUTPATIENT_CLINIC_OR_DEPARTMENT_OTHER): Payer: Medicare HMO

## 2022-02-15 ENCOUNTER — Ambulatory Visit: Payer: Medicare HMO | Admitting: Podiatry

## 2022-02-15 ENCOUNTER — Encounter (HOSPITAL_COMMUNITY): Payer: Self-pay | Admitting: Orthopaedic Surgery

## 2022-02-15 ENCOUNTER — Observation Stay (HOSPITAL_COMMUNITY)
Admission: RE | Admit: 2022-02-15 | Discharge: 2022-02-16 | Disposition: A | Discharge status: Home Health | Payer: Medicare HMO | Source: Ambulatory Visit | Attending: Orthopaedic Surgery | Admitting: Orthopaedic Surgery

## 2022-02-15 DIAGNOSIS — Z79899 Other long term (current) drug therapy: Secondary | ICD-10-CM | POA: Insufficient documentation

## 2022-02-15 DIAGNOSIS — Z96641 Presence of right artificial hip joint: Secondary | ICD-10-CM | POA: Diagnosis not present

## 2022-02-15 DIAGNOSIS — M1611 Unilateral primary osteoarthritis, right hip: Secondary | ICD-10-CM | POA: Diagnosis not present

## 2022-02-15 DIAGNOSIS — Z471 Aftercare following joint replacement surgery: Secondary | ICD-10-CM | POA: Diagnosis not present

## 2022-02-15 DIAGNOSIS — Z87891 Personal history of nicotine dependence: Secondary | ICD-10-CM | POA: Diagnosis not present

## 2022-02-15 DIAGNOSIS — M25451 Effusion, right hip: Secondary | ICD-10-CM | POA: Diagnosis not present

## 2022-02-15 DIAGNOSIS — J45909 Unspecified asthma, uncomplicated: Secondary | ICD-10-CM | POA: Diagnosis not present

## 2022-02-15 DIAGNOSIS — I10 Essential (primary) hypertension: Secondary | ICD-10-CM | POA: Insufficient documentation

## 2022-02-15 DIAGNOSIS — M659 Synovitis and tenosynovitis, unspecified: Secondary | ICD-10-CM | POA: Diagnosis not present

## 2022-02-15 DIAGNOSIS — Z7982 Long term (current) use of aspirin: Secondary | ICD-10-CM | POA: Diagnosis not present

## 2022-02-15 HISTORY — PX: TOTAL HIP ARTHROPLASTY: SHX124

## 2022-02-15 LAB — ABO/RH: ABO/RH(D): O POS

## 2022-02-15 SURGERY — ARTHROPLASTY, HIP, TOTAL, ANTERIOR APPROACH
Anesthesia: Spinal | Site: Hip | Laterality: Right

## 2022-02-15 MED ORDER — PHENYLEPHRINE 80 MCG/ML (10ML) SYRINGE FOR IV PUSH (FOR BLOOD PRESSURE SUPPORT)
PREFILLED_SYRINGE | INTRAVENOUS | Status: AC
  Filled 2022-02-15: qty 10

## 2022-02-15 MED ORDER — CEFAZOLIN SODIUM-DEXTROSE 2-4 GM/100ML-% IV SOLN
INTRAVENOUS | Status: AC
  Filled 2022-02-15: qty 100

## 2022-02-15 MED ORDER — SORBITOL 70 % SOLN: 30.0000 mL | Freq: Every day | Status: DC | PRN

## 2022-02-15 MED ORDER — PRONTOSAN WOUND IRRIGATION OPTIME
TOPICAL | Status: DC | PRN
  Administered 2022-02-15: 1 via TOPICAL

## 2022-02-15 MED ORDER — MIDAZOLAM HCL 2 MG/2ML IJ SOLN
INTRAMUSCULAR | Status: DC | PRN
  Administered 2022-02-15 (×2): 1 mg via INTRAVENOUS

## 2022-02-15 MED ORDER — METOCLOPRAMIDE HCL 5 MG/ML IJ SOLN: 5.0000 mg | Freq: Three times a day (TID) | INTRAMUSCULAR | Status: DC | PRN

## 2022-02-15 MED ORDER — METHOCARBAMOL 500 MG PO TABS
ORAL_TABLET | ORAL | Status: AC
  Filled 2022-02-15: qty 1

## 2022-02-15 MED ORDER — BUPIVACAINE-MELOXICAM ER 400-12 MG/14ML IJ SOLN
INTRAMUSCULAR | Status: DC | PRN
  Administered 2022-02-15: 400 mg

## 2022-02-15 MED ORDER — BUPIVACAINE IN DEXTROSE 0.75-8.25 % IT SOLN
INTRATHECAL | Status: DC | PRN
  Administered 2022-02-15: 1.6 mL via INTRATHECAL

## 2022-02-15 MED ORDER — SODIUM CHLORIDE 0.9 % IR SOLN
Status: DC | PRN
  Administered 2022-02-15: 1000 mL

## 2022-02-15 MED ORDER — ONDANSETRON HCL 4 MG/2ML IJ SOLN
INTRAMUSCULAR | Status: DC | PRN
  Administered 2022-02-15: 4 mg via INTRAVENOUS

## 2022-02-15 MED ORDER — FENTANYL CITRATE (PF) 100 MCG/2ML IJ SOLN
INTRAMUSCULAR | Status: AC
  Filled 2022-02-15: qty 2

## 2022-02-15 MED ORDER — KETOROLAC TROMETHAMINE 15 MG/ML IJ SOLN
INTRAMUSCULAR | Status: AC
  Filled 2022-02-15: qty 1

## 2022-02-15 MED ORDER — KETOROLAC TROMETHAMINE 15 MG/ML IJ SOLN
15.0000 mg | Freq: Four times a day (QID) | INTRAMUSCULAR | Status: AC
  Administered 2022-02-15 – 2022-02-16 (×4): 15 mg via INTRAVENOUS
  Filled 2022-02-15 (×3): qty 1

## 2022-02-15 MED ORDER — ASPIRIN 81 MG PO CHEW
81.0000 mg | CHEWABLE_TABLET | Freq: Two times a day (BID) | ORAL | Status: DC
  Administered 2022-02-15 – 2022-02-16 (×2): 81 mg via ORAL
  Filled 2022-02-15 (×2): qty 1

## 2022-02-15 MED ORDER — PROPOFOL 500 MG/50ML IV EMUL
INTRAVENOUS | Status: DC | PRN
  Administered 2022-02-15: 75 ug/kg/min via INTRAVENOUS

## 2022-02-15 MED ORDER — PHENYLEPHRINE 80 MCG/ML (10ML) SYRINGE FOR IV PUSH (FOR BLOOD PRESSURE SUPPORT)
PREFILLED_SYRINGE | INTRAVENOUS | Status: AC
  Filled 2022-02-15: qty 20

## 2022-02-15 MED ORDER — POVIDONE-IODINE 10 % EX SWAB
2.0000 | Freq: Once | CUTANEOUS | Status: AC
  Administered 2022-02-15: 2 via TOPICAL

## 2022-02-15 MED ORDER — LIDOCAINE 2% (20 MG/ML) 5 ML SYRINGE
INTRAMUSCULAR | Status: AC
  Filled 2022-02-15: qty 5

## 2022-02-15 MED ORDER — ACETAMINOPHEN 10 MG/ML IV SOLN
INTRAVENOUS | Status: AC
  Filled 2022-02-15: qty 100

## 2022-02-15 MED ORDER — FENTANYL CITRATE (PF) 100 MCG/2ML IJ SOLN
25.0000 ug | INTRAMUSCULAR | Status: DC | PRN
  Administered 2022-02-15 (×2): 50 ug via INTRAVENOUS

## 2022-02-15 MED ORDER — METOCLOPRAMIDE HCL 5 MG PO TABS: 5.0000 mg | ORAL_TABLET | Freq: Three times a day (TID) | ORAL | Status: DC | PRN

## 2022-02-15 MED ORDER — LACTATED RINGERS IV SOLN: INTRAVENOUS | Status: DC

## 2022-02-15 MED ORDER — FENTANYL CITRATE (PF) 250 MCG/5ML IJ SOLN
INTRAMUSCULAR | Status: AC
  Filled 2022-02-15: qty 5

## 2022-02-15 MED ORDER — ROCURONIUM BROMIDE 10 MG/ML (PF) SYRINGE
PREFILLED_SYRINGE | INTRAVENOUS | Status: AC
  Filled 2022-02-15: qty 10

## 2022-02-15 MED ORDER — CEFAZOLIN SODIUM-DEXTROSE 2-4 GM/100ML-% IV SOLN
2.0000 g | INTRAVENOUS | Status: AC
  Administered 2022-02-15: 2 g via INTRAVENOUS

## 2022-02-15 MED ORDER — TRANEXAMIC ACID-NACL 1000-0.7 MG/100ML-% IV SOLN
1000.0000 mg | INTRAVENOUS | Status: AC
  Administered 2022-02-15: 1000 mg via INTRAVENOUS
  Filled 2022-02-15: qty 100

## 2022-02-15 MED ORDER — DIPHENHYDRAMINE HCL 12.5 MG/5ML PO ELIX: 25.0000 mg | ORAL_SOLUTION | ORAL | Status: DC | PRN

## 2022-02-15 MED ORDER — MENTHOL 3 MG MT LOZG: 1.0000 | LOZENGE | OROMUCOSAL | Status: DC | PRN

## 2022-02-15 MED ORDER — PANTOPRAZOLE SODIUM 40 MG PO TBEC
40.0000 mg | DELAYED_RELEASE_TABLET | Freq: Every day | ORAL | Status: DC
  Administered 2022-02-15 – 2022-02-16 (×2): 40 mg via ORAL
  Filled 2022-02-15 (×2): qty 1

## 2022-02-15 MED ORDER — HYDROCODONE-ACETAMINOPHEN 5-325 MG PO TABS: 1.0000 | ORAL_TABLET | ORAL | Status: DC | PRN

## 2022-02-15 MED ORDER — ACETAMINOPHEN 10 MG/ML IV SOLN
INTRAVENOUS | Status: DC | PRN
  Administered 2022-02-15: 1000 mg via INTRAVENOUS

## 2022-02-15 MED ORDER — PHENYLEPHRINE 80 MCG/ML (10ML) SYRINGE FOR IV PUSH (FOR BLOOD PRESSURE SUPPORT)
PREFILLED_SYRINGE | INTRAVENOUS | Status: DC | PRN
  Administered 2022-02-15: 80 ug via INTRAVENOUS

## 2022-02-15 MED ORDER — ALUM & MAG HYDROXIDE-SIMETH 200-200-20 MG/5ML PO SUSP: 30.0000 mL | ORAL | Status: DC | PRN

## 2022-02-15 MED ORDER — ONDANSETRON HCL 4 MG/2ML IJ SOLN: 4.0000 mg | Freq: Four times a day (QID) | INTRAMUSCULAR | Status: DC | PRN

## 2022-02-15 MED ORDER — LOSARTAN POTASSIUM 50 MG PO TABS
100.0000 mg | ORAL_TABLET | Freq: Every day | ORAL | Status: DC
  Administered 2022-02-15 – 2022-02-16 (×2): 100 mg via ORAL
  Filled 2022-02-15 (×2): qty 2

## 2022-02-15 MED ORDER — ALBUTEROL SULFATE HFA 108 (90 BASE) MCG/ACT IN AERS: 1.0000 | INHALATION_SPRAY | Freq: Four times a day (QID) | RESPIRATORY_TRACT | Status: DC | PRN

## 2022-02-15 MED ORDER — DEXAMETHASONE SODIUM PHOSPHATE 10 MG/ML IJ SOLN
10.0000 mg | Freq: Once | INTRAMUSCULAR | Status: AC
  Administered 2022-02-16: 10 mg via INTRAVENOUS
  Filled 2022-02-15: qty 1

## 2022-02-15 MED ORDER — DIPHENHYDRAMINE HCL 50 MG/ML IJ SOLN
INTRAMUSCULAR | Status: AC
  Filled 2022-02-15: qty 1

## 2022-02-15 MED ORDER — BUPIVACAINE-MELOXICAM ER 400-12 MG/14ML IJ SOLN
INTRAMUSCULAR | Status: AC
  Filled 2022-02-15: qty 1

## 2022-02-15 MED ORDER — ONDANSETRON HCL 4 MG/2ML IJ SOLN
INTRAMUSCULAR | Status: AC
  Filled 2022-02-15: qty 2

## 2022-02-15 MED ORDER — PROPOFOL 10 MG/ML IV BOLUS: INTRAVENOUS | Status: DC | PRN

## 2022-02-15 MED ORDER — PHENYLEPHRINE HCL-NACL 20-0.9 MG/250ML-% IV SOLN
INTRAVENOUS | Status: DC | PRN
  Administered 2022-02-15: 20 ug/min via INTRAVENOUS

## 2022-02-15 MED ORDER — SODIUM CHLORIDE 0.9 % IV SOLN: INTRAVENOUS | Status: DC

## 2022-02-15 MED ORDER — DEXAMETHASONE SODIUM PHOSPHATE 10 MG/ML IJ SOLN
INTRAMUSCULAR | Status: AC
  Filled 2022-02-15: qty 1

## 2022-02-15 MED ORDER — METHOCARBAMOL 500 MG PO TABS
500.0000 mg | ORAL_TABLET | Freq: Four times a day (QID) | ORAL | Status: DC | PRN
  Administered 2022-02-15 – 2022-02-16 (×3): 500 mg via ORAL
  Filled 2022-02-15 (×2): qty 1

## 2022-02-15 MED ORDER — TRANEXAMIC ACID-NACL 1000-0.7 MG/100ML-% IV SOLN
1000.0000 mg | Freq: Once | INTRAVENOUS | Status: AC
  Administered 2022-02-15: 1000 mg via INTRAVENOUS
  Filled 2022-02-15: qty 100

## 2022-02-15 MED ORDER — ONDANSETRON HCL 4 MG PO TABS: 4.0000 mg | ORAL_TABLET | Freq: Four times a day (QID) | ORAL | Status: DC | PRN

## 2022-02-15 MED ORDER — ALBUTEROL SULFATE (2.5 MG/3ML) 0.083% IN NEBU: 2.5000 mg | INHALATION_SOLUTION | Freq: Four times a day (QID) | RESPIRATORY_TRACT | Status: DC | PRN

## 2022-02-15 MED ORDER — METHOCARBAMOL 1000 MG/10ML IJ SOLN: 500.0000 mg | Freq: Four times a day (QID) | INTRAVENOUS | Status: DC | PRN

## 2022-02-15 MED ORDER — ACETAMINOPHEN 500 MG PO TABS: 1000.0000 mg | ORAL_TABLET | Freq: Once | ORAL | Status: DC

## 2022-02-15 MED ORDER — FENTANYL CITRATE (PF) 250 MCG/5ML IJ SOLN
INTRAMUSCULAR | Status: DC | PRN
  Administered 2022-02-15 (×2): 25 ug via INTRAVENOUS
  Administered 2022-02-15: 50 ug via INTRAVENOUS

## 2022-02-15 MED ORDER — CEFAZOLIN SODIUM-DEXTROSE 2-4 GM/100ML-% IV SOLN
2.0000 g | Freq: Four times a day (QID) | INTRAVENOUS | Status: AC
  Administered 2022-02-15 – 2022-02-16 (×2): 2 g via INTRAVENOUS
  Filled 2022-02-15 (×2): qty 100

## 2022-02-15 MED ORDER — DROPERIDOL 2.5 MG/ML IJ SOLN: 0.6250 mg | Freq: Once | INTRAMUSCULAR | Status: DC | PRN

## 2022-02-15 MED ORDER — ORAL CARE MOUTH RINSE: 15.0000 mL | OROMUCOSAL | Status: DC | PRN

## 2022-02-15 MED ORDER — CHLORHEXIDINE GLUCONATE 0.12 % MT SOLN
OROMUCOSAL | Status: AC
  Administered 2022-02-15: 15 mL via OROMUCOSAL
  Filled 2022-02-15: qty 15

## 2022-02-15 MED ORDER — VANCOMYCIN HCL 1000 MG IV SOLR
INTRAVENOUS | Status: AC
  Filled 2022-02-15: qty 20

## 2022-02-15 MED ORDER — MORPHINE SULFATE (PF) 2 MG/ML IV SOLN: 0.5000 mg | INTRAVENOUS | Status: DC | PRN

## 2022-02-15 MED ORDER — PROPOFOL 10 MG/ML IV BOLUS
INTRAVENOUS | Status: AC
  Filled 2022-02-15: qty 20

## 2022-02-15 MED ORDER — ORAL CARE MOUTH RINSE: 15.0000 mL | Freq: Once | OROMUCOSAL | Status: AC

## 2022-02-15 MED ORDER — HYDROCODONE-ACETAMINOPHEN 7.5-325 MG PO TABS: 1.0000 | ORAL_TABLET | ORAL | Status: DC | PRN

## 2022-02-15 MED ORDER — POLYETHYLENE GLYCOL 3350 17 G PO PACK
17.0000 g | PACK | Freq: Every day | ORAL | Status: DC
  Administered 2022-02-16: 17 g via ORAL
  Filled 2022-02-15 (×2): qty 1

## 2022-02-15 MED ORDER — MIDAZOLAM HCL 2 MG/2ML IJ SOLN
INTRAMUSCULAR | Status: AC
  Filled 2022-02-15: qty 2

## 2022-02-15 MED ORDER — DEXAMETHASONE SODIUM PHOSPHATE 10 MG/ML IJ SOLN
INTRAMUSCULAR | Status: DC | PRN
  Administered 2022-02-15: 10 mg via INTRAVENOUS

## 2022-02-15 MED ORDER — FERROUS SULFATE 325 (65 FE) MG PO TABS
325.0000 mg | ORAL_TABLET | Freq: Three times a day (TID) | ORAL | Status: DC
  Administered 2022-02-15 – 2022-02-16 (×3): 325 mg via ORAL
  Filled 2022-02-15 (×3): qty 1

## 2022-02-15 MED ORDER — ACETAMINOPHEN 500 MG PO TABS
500.0000 mg | ORAL_TABLET | Freq: Four times a day (QID) | ORAL | Status: AC
  Administered 2022-02-15 – 2022-02-16 (×4): 500 mg via ORAL
  Filled 2022-02-15 (×4): qty 1

## 2022-02-15 MED ORDER — VANCOMYCIN HCL 1 G IV SOLR
INTRAVENOUS | Status: DC | PRN
  Administered 2022-02-15: 1000 mg

## 2022-02-15 MED ORDER — CHLORHEXIDINE GLUCONATE 0.12 % MT SOLN: 15.0000 mL | Freq: Once | OROMUCOSAL | Status: AC

## 2022-02-15 MED ORDER — TRANEXAMIC ACID 1000 MG/10ML IV SOLN
2000.0000 mg | INTRAVENOUS | Status: DC
  Filled 2022-02-15: qty 20

## 2022-02-15 MED ORDER — ACETAMINOPHEN 325 MG PO TABS: 325.0000 mg | ORAL_TABLET | Freq: Four times a day (QID) | ORAL | Status: DC | PRN

## 2022-02-15 MED ORDER — PHENOL 1.4 % MT LIQD: 1.0000 | OROMUCOSAL | Status: DC | PRN

## 2022-02-15 MED ORDER — MAGNESIUM CITRATE PO SOLN: 1.0000 | Freq: Once | ORAL | Status: DC | PRN

## 2022-02-15 MED ORDER — 0.9 % SODIUM CHLORIDE (POUR BTL) OPTIME
TOPICAL | Status: DC | PRN
  Administered 2022-02-15: 1000 mL

## 2022-02-15 MED ORDER — DOCUSATE SODIUM 100 MG PO CAPS
100.0000 mg | ORAL_CAPSULE | Freq: Two times a day (BID) | ORAL | Status: DC
  Administered 2022-02-15 – 2022-02-16 (×2): 100 mg via ORAL
  Filled 2022-02-15 (×3): qty 1

## 2022-02-15 SURGICAL SUPPLY — 68 items
ADH SKN CLS APL DERMABOND .7 (GAUZE/BANDAGES/DRESSINGS) ×1
BAG COUNTER SPONGE SURGICOUNT (BAG) ×2 IMPLANT
BAG DECANTER FOR FLEXI CONT (MISCELLANEOUS) ×2 IMPLANT
BAG SPNG CNTER NS LX DISP (BAG) ×1
CELLS DAT CNTRL 66122 CELL SVR (MISCELLANEOUS) IMPLANT
CLSR STERI-STRIP ANTIMIC 1/2X4 (GAUZE/BANDAGES/DRESSINGS) IMPLANT
COVER PERINEAL POST (MISCELLANEOUS) ×2 IMPLANT
COVER SURGICAL LIGHT HANDLE (MISCELLANEOUS) ×2 IMPLANT
DERMABOND ADVANCED .7 DNX12 (GAUZE/BANDAGES/DRESSINGS) IMPLANT
DRAPE C-ARM 42X72 X-RAY (DRAPES) ×2 IMPLANT
DRAPE POUCH INSTRU U-SHP 10X18 (DRAPES) ×2 IMPLANT
DRAPE STERI IOBAN 125X83 (DRAPES) ×2 IMPLANT
DRAPE U-SHAPE 47X51 STRL (DRAPES) ×4 IMPLANT
DRSG AQUACEL AG ADV 3.5X10 (GAUZE/BANDAGES/DRESSINGS) ×2 IMPLANT
DURAPREP 26ML APPLICATOR (WOUND CARE) ×4 IMPLANT
ELECT BLADE 4.0 EZ CLEAN MEGAD (MISCELLANEOUS) ×1
ELECT REM PT RETURN 9FT ADLT (ELECTROSURGICAL) ×1
ELECTRODE BLDE 4.0 EZ CLN MEGD (MISCELLANEOUS) ×2 IMPLANT
ELECTRODE REM PT RTRN 9FT ADLT (ELECTROSURGICAL) ×2 IMPLANT
GLOVE BIOGEL PI IND STRL 7.0 (GLOVE) ×4 IMPLANT
GLOVE BIOGEL PI IND STRL 7.5 (GLOVE) ×10 IMPLANT
GLOVE ECLIPSE 7.0 STRL STRAW (GLOVE) ×4 IMPLANT
GLOVE SKINSENSE STRL SZ7.5 (GLOVE) ×2 IMPLANT
GLOVE SURG SYN 7.5  E (GLOVE) ×2
GLOVE SURG SYN 7.5 E (GLOVE) ×2 IMPLANT
GLOVE SURG SYN 7.5 PF PI (GLOVE) ×4 IMPLANT
GLOVE SURG UNDER POLY LF SZ7 (GLOVE) ×2 IMPLANT
GLOVE SURG UNDER POLY LF SZ7.5 (GLOVE) ×4 IMPLANT
GOWN STRL REIN XL XLG (GOWN DISPOSABLE) ×2 IMPLANT
GOWN STRL REUS W/ TWL LRG LVL3 (GOWN DISPOSABLE) IMPLANT
GOWN STRL REUS W/ TWL XL LVL3 (GOWN DISPOSABLE) ×2 IMPLANT
GOWN STRL REUS W/TWL LRG LVL3 (GOWN DISPOSABLE)
GOWN STRL REUS W/TWL XL LVL3 (GOWN DISPOSABLE) ×1
HANDPIECE INTERPULSE COAX TIP (DISPOSABLE) ×1
HEAD CERAMIC DELTA 36 PLUS 1.5 (Hips) IMPLANT
HOOD PEEL AWAY FLYTE STAYCOOL (MISCELLANEOUS) ×4 IMPLANT
IV NS IRRIG 3000ML ARTHROMATIC (IV SOLUTION) ×2 IMPLANT
JET LAVAGE IRRISEPT WOUND (IRRIGATION / IRRIGATOR) ×1
KIT BASIN OR (CUSTOM PROCEDURE TRAY) ×2 IMPLANT
LAVAGE JET IRRISEPT WOUND (IRRIGATION / IRRIGATOR) ×2 IMPLANT
LINER NEUTRAL 52X36MM PLUS 4 (Liner) IMPLANT
MARKER SKIN DUAL TIP RULER LAB (MISCELLANEOUS) ×2 IMPLANT
NDL SPNL 18GX3.5 QUINCKE PK (NEEDLE) ×2 IMPLANT
NEEDLE SPNL 18GX3.5 QUINCKE PK (NEEDLE) ×1 IMPLANT
PACK TOTAL JOINT (CUSTOM PROCEDURE TRAY) ×2 IMPLANT
PACK UNIVERSAL I (CUSTOM PROCEDURE TRAY) ×2 IMPLANT
PIN SECTOR W/GRIP ACE CUP 52MM (Hips) IMPLANT
RETRACTOR WND ALEXIS 18 MED (MISCELLANEOUS) IMPLANT
RTRCTR WOUND ALEXIS 18CM MED (MISCELLANEOUS)
SAW OSC TIP CART 19.5X105X1.3 (SAW) ×2 IMPLANT
SCREW 6.5MMX25MM (Screw) IMPLANT
SET HNDPC FAN SPRY TIP SCT (DISPOSABLE) ×2 IMPLANT
STAPLER VISISTAT 35W (STAPLE) IMPLANT
STEM FEM ACTIS HIGH SZ2 (Stem) IMPLANT
SUT ETHIBOND 2 V 37 (SUTURE) ×2 IMPLANT
SUT ETHILON 2 0 PSLX (SUTURE) IMPLANT
SUT VIC AB 0 CT1 27 (SUTURE) ×1
SUT VIC AB 0 CT1 27XBRD ANBCTR (SUTURE) ×2 IMPLANT
SUT VIC AB 1 CTX 36 (SUTURE) ×1
SUT VIC AB 1 CTX36XBRD ANBCTR (SUTURE) ×2 IMPLANT
SUT VIC AB 2-0 CT1 27 (SUTURE) ×2
SUT VIC AB 2-0 CT1 TAPERPNT 27 (SUTURE) ×4 IMPLANT
SYR 50ML LL SCALE MARK (SYRINGE) ×2 IMPLANT
TOWEL GREEN STERILE (TOWEL DISPOSABLE) ×2 IMPLANT
TRAY CATH 16FR W/PLASTIC CATH (SET/KITS/TRAYS/PACK) IMPLANT
TRAY FOLEY W/BAG SLVR 16FR (SET/KITS/TRAYS/PACK) ×1
TRAY FOLEY W/BAG SLVR 16FR ST (SET/KITS/TRAYS/PACK) ×2 IMPLANT
YANKAUER SUCT BULB TIP NO VENT (SUCTIONS) ×2 IMPLANT

## 2022-02-15 NOTE — Anesthesia Procedure Notes (Signed)
Spinal  Patient location during procedure: OR Start time: 02/15/2022 10:47 AM End time: 02/15/2022 10:50 AM Reason for block: surgical anesthesia Staffing Performed: anesthesiologist  Anesthesiologist: Brennan Bailey, MD Performed by: Brennan Bailey, MD Authorized by: Brennan Bailey, MD   Preanesthetic Checklist Completed: patient identified, IV checked, risks and benefits discussed, surgical consent, monitors and equipment checked, pre-op evaluation and timeout performed Spinal Block Patient position: sitting Prep: DuraPrep and site prepped and draped Patient monitoring: continuous pulse ox, blood pressure and heart rate Approach: midline Location: L3-4 Injection technique: single-shot Needle Needle type: Pencan  Needle gauge: 24 G Needle length: 9 cm Assessment Events: CSF return Additional Notes Risks, benefits, and alternative discussed. Patient gave consent to procedure. Prepped and draped in sitting position. Patient sedated but responsive to voice. Clear CSF obtained after one needle redirection. Positive terminal aspiration. No pain or paraesthesias with injection. Patient tolerated procedure well. Vital signs stable. Tawny Asal, MD

## 2022-02-15 NOTE — Anesthesia Postprocedure Evaluation (Signed)
Anesthesia Post Note  Patient: Ellen Nelson  Procedure(s) Performed: RIGHT TOTAL HIP ARTHROPLASTY ANTERIOR APPROACH (Right: Hip)     Patient location during evaluation: PACU Anesthesia Type: Spinal Level of consciousness: awake and alert Pain management: pain level controlled Vital Signs Assessment: post-procedure vital signs reviewed and stable Respiratory status: spontaneous breathing, nonlabored ventilation and respiratory function stable Cardiovascular status: blood pressure returned to baseline Postop Assessment: no apparent nausea or vomiting, spinal receding, no headache and no backache Anesthetic complications: no   No notable events documented.  Last Vitals:  Vitals:   02/15/22 1345 02/15/22 1400  BP: (!) 124/97 117/78  Pulse: 85 62  Resp: 12 12  Temp:    SpO2: 96% 99%    Last Pain:  Vitals:   02/15/22 1345  PainSc: Asleep                 Marthenia Rolling

## 2022-02-15 NOTE — Op Note (Signed)
RIGHT TOTAL HIP ARTHROPLASTY ANTERIOR APPROACH  Procedure Note NAYDEEN SPEIRS   [id number]  Pre-op Diagnosis: right hip degenerative joint disease     Post-op Diagnosis: same  Operative Findings Complete loss of articular cartilage Synovitis, joint effusion Medial wear of acetabulum   Operative Procedures  1. Total hip replacement; Right hip; uncemented [id number]   Surgeon: Gershon Mussel, M.D.  Assist: Oneal Grout, PA-C   Anesthesia: spinal  Prosthesis: Depuy Acetabulum: Pinnacle 52 mm Femur: Actis 2 HO Head: 36 mm size: +1.5 Liner: +4 Bearing Type: ceramic/poly  Total Hip Arthroplasty (Anterior Approach) Op Note:  After informed consent was obtained and the operative extremity marked in the holding area, the patient was brought back to the operating room and placed supine on the HANA table. Next, the operative extremity was prepped and draped in normal sterile fashion. Surgical timeout occurred verifying patient identification, surgical site, surgical procedure and administration of antibiotics.  A modified anterior Smith-Peterson approach to the hip was performed, using the interval between tensor fascia lata and sartorius.  Dissection was carried bluntly down onto the anterior hip capsule. The lateral femoral circumflex vessels were identified and coagulated. A capsulotomy was performed and the capsular flaps tagged for later repair.  The neck osteotomy was performed. The femoral head was removed which showed severe wear, the acetabular rim was cleared of soft tissue and attention was turned to reaming the acetabulum.  Sequential reaming was performed under fluoroscopic guidance. I was careful not to ream too medially as patient had medial wear already.  We reamed to a size 51 mm, and then impacted the acetabular shell. A 25 mm cancellous screw was placed through the shell for added fixation.  The liner was then placed after irrigation and attention turned to the femur.   After placing the femoral hook, the leg was taken to externally rotated, extended and adducted position taking care to perform soft tissue releases to allow for adequate mobilization of the femur. Soft tissue was cleared from the shoulder of the greater trochanter and the hook elevator used to improve exposure of the proximal femur. Sequential broaching performed up to a size 2. Trial neck and head were placed. The leg was brought back up to neutral and the construct reduced.  Antibiotic irrigation was placed in the surgical wound.  The position and sizing of components, offset and leg lengths were checked using fluoroscopy. Stability of the construct was checked in extension and external rotation without any subluxation or impingement of prosthesis. We dislocated the prosthesis, dropped the leg back into position, removed trial components, and irrigated copiously. The final stem and head was then placed, the leg brought back up, the system reduced and fluoroscopy used to verify positioning.  We irrigated, obtained hemostasis and closed the capsule using #2 ethibond suture.  One gram of vancomycin powder was placed in the surgical bed.   One gram of topical tranexamic acid was injected into the joint.  The fascia was closed with #1 vicryl plus, the deep fat layer was closed with 0 vicryl, the subcutaneous layers closed with 2.0 Vicryl Plus and the skin closed with 2.0 nylon and dermabond. A sterile dressing was applied. The patient was awakened in the operating room and taken to recovery in stable condition.  All sponge, needle, and instrument counts were correct at the end of the case.   Tessa Lerner, my PA, was a medical necessity for opening, closing, limb positioning, retracting, exposing, and overall facilitation and timely completion  of the surgery.  Position: supine  Complications: see description of procedure.  Time Out: performed   Drains/Packing: none  Estimated blood loss: see anesthesia  record  Returned to Recovery Room: in good condition.   Antibiotics: yes   Mechanical VTE (DVT) Prophylaxis: sequential compression devices, TED thigh-high  Chemical VTE (DVT) Prophylaxis: aspirin   Fluid Replacement: see anesthesia record  Specimens Removed: 1 to pathology   Sponge and Instrument Count Correct? yes   PACU: portable radiograph - low AP   Plan/RTC: Return in 2 weeks for staple removal. Weight Bearing/Load Lower Extremity: full  Hip precautions: none Suture Removal: 2 weeks   N. Eduard Roux, MD [facility] 12:18 PM   Implant Name Type Inv. Item Serial No. Manufacturer Lot No. LRB No. Used Action  PIN SECTOR W/GRIP ACE CUP 52MM - [id number] Hips PIN SECTOR W/GRIP ACE CUP 52MM  [employer] [id number] Right 1 Implanted  SCREW 6.5MMX25MM - [id number] Screw SCREW 6.5MMX25MM  [employer] [id number] Right 1 Implanted  LINER NEUTRAL 52X36MM PLUS 4 - [id number] Liner LINER NEUTRAL 52X36MM PLUS 4  [employer] [id number] Right 1 Implanted  STEM FEM ACTIS HIGH SZ2 - [id number] Stem STEM FEM ACTIS HIGH SZ2  [employer] [id number] Right 1 Implanted  HEAD CERAMIC DELTA 36 PLUS 1.5 - [id number] Hips HEAD CERAMIC DELTA 36 PLUS 1.5  [employer] [id number] Right 1 Implanted

## 2022-02-15 NOTE — Discharge Instructions (Signed)

## 2022-02-15 NOTE — H&P (Signed)
PREOPERATIVE H&P  Chief Complaint: right hip degenerative joint disease  HPI: Ellen Nelson is a 66 y.o. female who presents for surgical treatment of right hip degenerative joint disease.  She denies any changes in medical history.  Past Medical History:  Diagnosis Date   Allergy    Arthritis    Asthma    Hypertension    Mitral prolapse    Per patient   Rotator cuff tear    Right shoulder   Past Surgical History:  Procedure Laterality Date   ABDOMINAL HYSTERECTOMY     RETAINED PLACENTA REMOVAL     32 years ago per patient   TONSILLECTOMY     Social History   Socioeconomic History   Marital status: Single    Spouse name: Not on file   Number of children: Not on file   Years of education: Not on file   Highest education level: Not on file  Occupational History   Not on file  Tobacco Use   Smoking status: Former    Types: Cigarettes    Quit date: 2008    Years since quitting: 15.7   Smokeless tobacco: Never  Vaping Use   Vaping Use: Never used  Substance and Sexual Activity   Alcohol use: Yes    Comment: seldom   Drug use: Never   Sexual activity: Not on file  Other Topics Concern   Not on file  Social History Narrative   Not on file   Social Determinants of Health   Financial Resource Strain: Not on file  Food Insecurity: Not on file  Transportation Needs: Not on file  Physical Activity: Not on file  Stress: Not on file  Social Connections: Not on file   Family History  Problem Relation Age of Onset   Hypertension Mother    Hiatal hernia Mother    Heart disease Mother    Dementia Father    Hypertension Father    Cancer - Colon Father    Osteoarthritis Sister    Breast cancer Sister    Heart disease Brother    Early death Brother    Allergies  Allergen Reactions   Percocet [Oxycodone-Acetaminophen] Other (See Comments)    Patient states Percocet caused severe hallucinations.   Sulfa Antibiotics Hives   Prior to Admission  medications   Medication Sig Start Date End Date Taking? Authorizing Provider  aspirin EC 81 MG tablet Take 1 tablet (81 mg total) by mouth 2 (two) times daily. To be taken after surgery to prevent blood clots 02/08/22 02/08/23  Cristie Hem, PA-C  cholecalciferol (VITAMIN D) 1000 units tablet Take 2,000 Units by mouth daily.    Yes [provider]  ciclopirox (PENLAC) 8 % solution Apply topically at bedtime. Apply over nail and surrounding skin. Apply daily over previous coat. After seven (7) days, may remove with alcohol and continue cycle. Patient taking differently: Apply 1 Application topically at bedtime as needed (toenail fungus). Apply over nail and surrounding skin. Apply daily over previous coat. After seven (7) days, may remove with alcohol and continue cycle. 10/15/21  Yes McDonald, Rachelle Hora, DPM  docusate sodium (COLACE) 100 MG capsule Take 1 capsule (100 mg total) by mouth daily as needed. 02/08/22 02/08/23  Cristie Hem, PA-C  fexofenadine (ALLEGRA) 60 MG tablet Take 60 mg by mouth daily.    Yes [provider]  fluticasone (FLONASE) 50 MCG/ACT nasal spray Place 2 sprays into both nostrils daily. 05/15/18  Yes Delton See,  Jennette Banker, MD  HYDROcodone-acetaminophen (NORCO) 7.5-325 MG tablet Take 1-2 tablets by mouth every 6 (six) hours as needed for moderate pain. To be taken after surgery 02/08/22   Aundra Dubin, PA-C  losartan (COZAAR) 100 MG tablet Take 100 mg by mouth daily.   Yes [provider]  methocarbamol (ROBAXIN-750) 750 MG tablet Take 1 tablet (750 mg total) by mouth 2 (two) times daily as needed for muscle spasms. 02/08/22   Aundra Dubin, PA-C  Multiple Vitamins-Minerals (HAIR SKIN AND NAILS FORMULA PO) Take 1 tablet by mouth daily.   Yes [provider]  ondansetron (ZOFRAN) 4 MG tablet Take 1 tablet (4 mg total) by mouth every 8 (eight) hours as needed for nausea or vomiting. 02/08/22   Aundra Dubin, PA-C  Polyethyl Glycol-Propyl  Glycol (SYSTANE OP) Place 1 drop into both eyes in the morning and at bedtime.   Yes [provider]  vitamin B-12 (CYANOCOBALAMIN) 100 MCG tablet Take 100 mcg by mouth daily.   Yes [provider]  albuterol (PROVENTIL) (2.5 MG/3ML) 0.083% nebulizer solution Take 2.5 mg by nebulization every 6 (six) hours as needed for wheezing or shortness of breath.    [provider]  albuterol (VENTOLIN HFA) 108 (90 Base) MCG/ACT inhaler Inhale 1-2 puffs into the lungs every 6 (six) hours as needed for wheezing or shortness of breath.    [provider]  doxycycline (VIBRAMYCIN) 100 MG capsule Take 1 capsule (100 mg total) by mouth 2 (two) times daily. Patient not taking: Reported on 02/03/2022 04/28/21   Francene Finders, PA-C  traMADol (ULTRAM) 50 MG tablet Take 1 tablet (50 mg total) by mouth every 12 (twelve) hours as needed. 07/28/21   Aundra Dubin, PA-C     Positive ROS: All other systems have been reviewed and were otherwise negative with the exception of those mentioned in the HPI and as above.  Physical Exam: General: Alert, no acute distress Cardiovascular: No pedal edema Respiratory: No cyanosis, no use of accessory musculature GI: abdomen soft Skin: No lesions in the area of chief complaint Neurologic: Sensation intact distally Psychiatric: Patient is competent for consent with normal mood and affect Lymphatic: no lymphedema  MUSCULOSKELETAL: exam stable  Assessment: right hip degenerative joint disease  Plan: Plan for Procedure(s): RIGHT TOTAL HIP ARTHROPLASTY ANTERIOR APPROACH  The risks benefits and alternatives were discussed with the patient including but not limited to the risks of nonoperative treatment, versus surgical intervention including infection, bleeding, nerve injury,  blood clots, cardiopulmonary complications, morbidity, mortality, among others, and they were willing to proceed.   Eduard Roux, MD 02/15/2022 8:59 AM

## 2022-02-15 NOTE — Transfer of Care (Signed)
Immediate Anesthesia Transfer of Care Note  Patient: ELIZEBETH KLUESNER  Procedure(s) Performed: RIGHT TOTAL HIP ARTHROPLASTY ANTERIOR APPROACH (Right: Hip)  Patient Location: PACU  Anesthesia Type:Spinal  Level of Consciousness: awake, alert  and oriented  Airway & Oxygen Therapy: Patient Spontanous Breathing  Post-op Assessment: Report given to RN and Post -op Vital signs reviewed and stable  Post vital signs: Reviewed and stable  Last Vitals:  Vitals Value Taken Time  BP 119/80 02/15/22 1254  Temp    Pulse 92 02/15/22 1256  Resp 18 02/15/22 1256  SpO2 97 % 02/15/22 1256  Vitals shown include unvalidated device data.  Last Pain:  Vitals:   02/15/22 0824  PainSc: 0-No pain         Complications: No notable events documented.

## 2022-02-16 ENCOUNTER — Encounter (HOSPITAL_COMMUNITY): Payer: Self-pay | Admitting: Orthopaedic Surgery

## 2022-02-16 DIAGNOSIS — Z87891 Personal history of nicotine dependence: Secondary | ICD-10-CM | POA: Diagnosis not present

## 2022-02-16 DIAGNOSIS — Z7982 Long term (current) use of aspirin: Secondary | ICD-10-CM | POA: Diagnosis not present

## 2022-02-16 DIAGNOSIS — M1611 Unilateral primary osteoarthritis, right hip: Secondary | ICD-10-CM | POA: Diagnosis not present

## 2022-02-16 DIAGNOSIS — Z79899 Other long term (current) drug therapy: Secondary | ICD-10-CM | POA: Diagnosis not present

## 2022-02-16 DIAGNOSIS — J45909 Unspecified asthma, uncomplicated: Secondary | ICD-10-CM | POA: Diagnosis not present

## 2022-02-16 DIAGNOSIS — I10 Essential (primary) hypertension: Secondary | ICD-10-CM | POA: Diagnosis not present

## 2022-02-16 LAB — CBC
HCT: 33 % — ABNORMAL LOW (ref 36.0–46.0)
Hemoglobin: 10.9 g/dL — ABNORMAL LOW (ref 12.0–15.0)
MCH: 27.9 pg (ref 26.0–34.0)
MCHC: 33 g/dL (ref 30.0–36.0)
MCV: 84.6 fL (ref 80.0–100.0)
Platelets: 218 10*3/uL (ref 150–400)
RBC: 3.9 MIL/uL (ref 3.87–5.11)
RDW: 13.6 % (ref 11.5–15.5)
WBC: 10.9 10*3/uL — ABNORMAL HIGH (ref 4.0–10.5)
nRBC: 0 % (ref 0.0–0.2)

## 2022-02-16 NOTE — Evaluation (Signed)
Physical Therapy Evaluation Patient Details Name: Ellen Nelson MRN: [id number] DOB: Oct 15, 1955 Today's Date: 02/16/2022  History of Present Illness  Admitted for R THA, done 9/25; WBAT;  has a past medical history of Allergy, Arthritis, Asthma, Hypertension, Mitral prolapse, and Rotator cuff tear.  Clinical Impression   Pt admitted with above diagnosis. 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 Pt will benefit from skilled PT to increase their independence and safety with mobility to allow discharge to the venue listed below.          Recommendations for follow up therapy are one component of a multi-disciplinary discharge planning process, led by the attending physician.  Recommendations may be updated based on patient status, additional functional criteria and insurance authorization.  Follow Up Recommendations Home health PT      Assistance Recommended at Discharge Intermittent Supervision/Assistance  Patient can return home with the following  Help with stairs or ramp for entrance;A little help with walking and/or transfers    Equipment Recommendations Rolling walker (2 wheels);BSC/3in1  Recommendations for Other Services       Functional Status Assessment Patient has had a recent decline in their functional status and demonstrates the ability to make significant improvements in function in a reasonable and predictable amount of time.      Precautions / Restrictions Precautions Precautions: Fall Precaution Comments: So motivated that she might walk without RW prematurely Restrictions Weight Bearing Restrictions: No RLE Weight Bearing: Weight bearing as tolerated      Mobility  Bed Mobility Overal bed mobility: Needs Assistance Bed Mobility: Supine to Sit     Supine to sit: Mod assist     General bed mobility comments: Mod assist to pull to sit and use of bed pad to shift hips and square off at EOB    Transfers Overall transfer level: Needs assistance Equipment used: Rolling walker (2 wheels) Transfers: Sit to/from Stand Sit to Stand: Mod assist, Min assist           General transfer comment: Initail stand from elevated bed with mod assist to power up and steady; second sit to stand from 3in1 over commode with min assist    Ambulation/Gait Ambulation/Gait assistance: Min assist Gait Distance (Feet): 25 Feet Assistive device: Rolling walker (2 wheels) Gait Pattern/deviations: Step-through pattern Gait velocity: slowed     General Gait Details: Light min assist mostly for RW management as she tended to step outside RW; cues for step sequence and rW proximity; Took a few steps without RW to sink with min assist to steady  Science writer    Modified Rankin (Stroke Patients Only)       Balance Overall balance assessment: Mild deficits observed, not formally tested  Pertinent Vitals/Pain      Home Living Family/patient expects to be discharged to:: Private residence Living Arrangements: Children;Other relatives Available Help at Discharge: Family;Available 24 hours/day Type of Home: House Home Access: Stairs to enter Entrance Stairs-Rails: None Entrance Stairs-Number of Steps: 4 Alternate Level Stairs-Number of Steps: 14 Home Layout: Two level;Bed/bath upstairs;Other (Comment) (plans to stay on first  level as long as needed) Home Equipment: None      Prior Function Prior Level of Function : Independent/Modified Independent             Mobility Comments: walks with no device; has negotiated stairs step-by-step due to pain leading up to surgery ADLs Comments: no difficulty; works at [employer]: Right    Extremity/Trunk Assessment   Upper Extremity Assessment Upper Extremity Assessment: Overall WFL for tasks assessed (for simple mobiltiy and ADLs; has rotator cuff problems)    Lower Extremity Assessment Lower Extremity Assessment: RLE deficits/detail RLE Deficits / Details: Grossly decr AROM and strength, limited by pain postop RLE: Unable to fully assess due to pain       Communication   Communication: No difficulties  Cognition Arousal/Alertness: Awake/alert Behavior During Therapy: WFL for tasks assessed/performed (Motivated to get better) Overall Cognitive Status: Within Functional Limits for tasks assessed                                          General Comments General comments (skin integrity, edema, etc.): Very painful with bed mobility, but I posit this was more related to first time moving and the unknown; Discussed plans for stair negotioation next session and considerations for car transfers and ride home    Exercises Total Joint Exercises Ankle Circles/Pumps: AROM, Both, 10 reps Quad Sets: AROM, Right, 5 reps Heel Slides: AAROM, Right, 5 reps (short ROM) Hip ABduction/ADduction: AAROM, Right, 5 reps Long Arc Quad: AROM, Right, 5 reps   Assessment/Plan    PT Assessment Patient needs continued PT services  PT Problem List Decreased strength;Decreased range of motion;Decreased activity tolerance;Decreased knowledge of use of DME;Decreased safety awareness;Pain       PT Treatment Interventions DME instruction;Gait training;Stair training;Functional mobility training;Therapeutic  activities;Therapeutic exercise;Balance training;Patient/family education    PT Goals (Current goals can be found in the Care Plan section)  Acute Rehab PT Goals Patient Stated Goal: Hopes to get home today PT Goal Formulation: With patient Time For Goal Achievement: 02/23/22 Potential to Achieve Goals: Good    Frequency 7X/week     Co-evaluation               AM-PAC PT "6 Clicks" Mobility  Outcome Measure Help needed turning from your back to your side while in a flat bed without using bedrails?: A Little Help needed moving from lying on your back to sitting on the side of a flat bed without using bedrails?: A Lot Help needed moving to and from a bed to a chair (including a wheelchair)?: A Lot Help needed standing up from a chair using your arms (e.g., wheelchair or bedside chair)?: A Little Help needed to walk in hospital room?: A Little Help needed climbing 3-5 steps with a railing? : A Lot 6 Click Score: 15    End of Session Equipment Utilized During Treatment: Gait belt Activity Tolerance: Patient tolerated treatment well Patient left: in chair;with call bell/phone within  reach;with family/visitor present Nurse Communication: Mobility status PT Visit Diagnosis: Pain;Other abnormalities of gait and mobility (R26.89) Pain - Right/Left: Right Pain - part of body: Hip    Time: 0937-1009 PT Time Calculation (min) (ACUTE ONLY): 32 min   Charges:   PT Evaluation $PT Eval Moderate Complexity: 1 Mod PT Treatments $Gait Training: 8-22 mins        Roney Marion, PT  Acute Rehabilitation Services Office [phone]   [place]letta [place] 02/16/2022, 11:23 AM

## 2022-02-16 NOTE — Care Plan (Signed)
Ortho Bundle Case Management Note  Patient Details  Name: Ellen Nelson MRN: [id number] Date of Birth: [birth year]   [facility] RNCM call to patient to discuss her upcoming Right total hip arthroplasty with Dr. Erlinda Hong on 02/15/22. She is an Ortho bundle patient through [facility] and is agreeable to case management. She lives with family (daughters and Son in Sports coach) and has a large extended family that will be assisting after discharge. She will need a RW and 3in1/BSC at discharge. This will be provided at the hospital. Anticipate HHPT will be needed after a short hospital stay. Referral made to [facility] after choice provided. Reviewed all post op care instructions. Will continue to follow for needs.                  DME Arranged:  Berta Minor DME Agency:  [facility]  HH Arranged:  PT HH Agency:  [place]  Additional Comments: Please contact me with any questions of if this plan should need to change.  Jamse Arn, RN, BSN, [employer]  [phone] 02/16/2022, 8:26 AM

## 2022-02-16 NOTE — Progress Notes (Signed)
Foley catheter removed at 0700. Pt due to void

## 2022-02-16 NOTE — Progress Notes (Signed)
Physical Therapy Treatment Patient Details Name: Ellen Nelson MRN: [id number] DOB: Jun 03, 1955 Today's Date: 02/16/2022   History of Present Illness Admitted for R THA, done 9/25; WBAT;  has a past medical history of Allergy, Arthritis, Asthma, Hypertension, Mitral prolapse, and Rotator cuff tear.    PT Comments    Continuing work on functional mobility and activity tolerance;  Session focused on stair training in prep for dc home, with good demonstration of technqiue; Questions solicited and answered; OK for dc home from PT standpoint    Recommendations for follow up therapy are one component of a multi-disciplinary discharge planning process, led by the attending physician.  Recommendations may be updated based on patient status, additional functional criteria and insurance authorization.  Follow Up Recommendations  Home health PT     Assistance Recommended at Discharge Intermittent Supervision/Assistance  Patient can return home with the following Help with stairs or ramp for entrance;A little help with walking and/or transfers   Equipment Recommendations  Rolling walker (2 wheels);BSC/3in1    Recommendations for Other Services       Precautions / Restrictions Precautions Precautions: Fall Precaution Comments: Fall risk is present, but minimal Restrictions RLE Weight Bearing: Weight bearing as tolerated     Mobility  Bed Mobility Overal bed mobility: Needs Assistance Bed Mobility: Supine to Sit     Supine to sit: Mod assist     General bed mobility comments: Mod assist to pull to sit and use of bed pad to shift hips and square off at EOB    Transfers Overall transfer level: Needs assistance Equipment used: Rolling walker (2 wheels) Transfers: Sit to/from Stand Sit to Stand: Min assist           General transfer comment: Stood from recliner with cues for hand placement and safety    Ambulation/Gait Ambulation/Gait assistance: Land  (Feet): 110 Feet Assistive device: Rolling walker (2 wheels) Gait Pattern/deviations: Step-through pattern Gait velocity: slowed     General Gait Details: RW adjusted for proper fit; better, steadier steps this afternoon with better use of rW for stabiltiy   Stairs Stairs: Yes Stairs assistance: Min assist Stair Management: No rails, Backwards, With walker, Step to pattern Number of Stairs: 5 General stair comments: Educated pt in sequence and technique; good follow-through, and pt voiced confidence in ability to direct her daughter to steady rW with this technique   Wheelchair Mobility    Modified Rankin (Stroke Patients Only)       Balance Overall balance assessment: Mild deficits observed, not formally tested                                          Cognition Arousal/Alertness: Awake/alert Behavior During Therapy: WFL for tasks assessed/performed Overall Cognitive Status: Within Functional Limits for tasks assessed                                          Exercises Total Joint Exercises Ankle Circles/Pumps: AROM, Both, 10 reps Quad Sets: AROM, Right, 5 reps Heel Slides: AAROM, Right, 5 reps (short ROM) Hip ABduction/ADduction: AAROM, Right, 5 reps Long Arc Quad: AROM, Right, 5 reps    General Comments General comments (skin integrity, edema, etc.): Provided pt with handout with step-by-step instructions for stair negotiation  Pertinent Vitals/Pain      Home Living                          Prior Function            PT Goals (current goals can now be found in the care plan section) Acute Rehab PT Goals Patient Stated Goal: Hopes to get home today PT Goal Formulation: With patient Time For Goal Achievement: 02/23/22 Potential to Achieve Goals: Good Progress towards PT goals: Progressing toward goals    Frequency    7X/week      PT Plan Current plan remains appropriate    Co-evaluation               AM-PAC PT "6 Clicks" Mobility   Outcome Measure  Help needed turning from your back to your side while in a flat bed without using bedrails?: A Little Help needed moving from lying on your back to sitting on the side of a flat bed without using bedrails?: A Little Help needed moving to and from a bed to a chair (including a wheelchair)?: A Little Help needed standing up from a chair using your arms (e.g., wheelchair or bedside chair)?: A Little Help needed to walk in hospital room?: A Little Help needed climbing 3-5 steps with a railing? : A Little 6 Click Score: 18    End of Session Equipment Utilized During Treatment: Gait belt Activity Tolerance: Patient tolerated treatment well Patient left: in chair;with call bell/phone within reach;with family/visitor present Nurse Communication: Mobility status (Second PT session complete; ok for dc) PT Visit Diagnosis: Pain;Other abnormalities of gait and mobility (R26.89) Pain - Right/Left: Right Pain - part of body: Hip     Time: 6063-0160 PT Time Calculation (min) (ACUTE ONLY): 41 min  Charges:  $Gait Training: 23-37 mins $Therapeutic Activity: 8-22 mins                     Roney Marion, PT  Acute Rehabilitation Services Office [place] 02/16/2022, 3:15 PM

## 2022-02-16 NOTE — Progress Notes (Signed)
Discharge instructions reviewed with pt.  Copy of instructions given to pt.  No new scripts on d/c orders, but pt states daughter picked up scripts that were sent in on Thursday last week for pt's post op surgery.   Pt equipment is here at bedside.  Daughter coming to pick pt up. Pt has a young granddaughter at bedside, pt not appropriate to go to d/c lounge.   Await pt's daughter to arrive., pt has called daughter.

## 2022-02-16 NOTE — Progress Notes (Signed)
Subjective: 1 Day Post-Op Procedure(s) (LRB): RIGHT TOTAL HIP ARTHROPLASTY ANTERIOR APPROACH (Right) Patient reports pain as mild.    Objective: Vital signs in last 24 hours: Temp:  [97.7 F (36.5 C)-98.5 F (36.9 C)] 98.3 F (36.8 C) (09/26 0410) Pulse Rate:  [52-92] 72 (09/26 0410) Resp:  [11-20] 18 (09/26 0410) BP: (113-150)/(62-97) 118/66 (09/26 0410) SpO2:  [93 %-100 %] 97 % (09/26 0410) Weight:  [97.5 kg] 97.5 kg (09/25 0810)  Intake/Output from previous day: 09/25 0701 - 09/26 0700 In: 2260.1 [P.O.:480; I.V.:1780.1] Out: 1000 [Urine:800; Blood:200] Intake/Output this shift: Total I/O In: -  Out: 1100 [Urine:1100]  Recent Labs    02/16/22 0210  HGB 10.9*   Recent Labs    02/16/22 0210  WBC 10.9*  RBC 3.90  HCT 33.0*  PLT 218   No results for input(s): "NA", "K", "CL", "CO2", "BUN", "CREATININE", "GLUCOSE", "CALCIUM" in the last 72 hours. No results for input(s): "LABPT", "INR" in the last 72 hours.  Neurologically intact Neurovascular intact Sensation intact distally Intact pulses distally Dorsiflexion/Plantar flexion intact Incision: dressing C/D/I No cellulitis present Compartment soft   Assessment/Plan: 1 Day Post-Op Procedure(s) (LRB): RIGHT TOTAL HIP ARTHROPLASTY ANTERIOR APPROACH (Right) Up with therapy WBAT RLE ABLA- mild and stable D/c home once cleared by PT       Aundra Dubin 02/16/2022, 7:09 AM

## 2022-02-16 NOTE — TOC Transition Note (Signed)
Transition of Care [facility]) - CM/SW Discharge Note   Patient Details  Name: Ellen Nelson MRN: [id number] Date of Birth: January 01, 1956  [facility] CM/SW Contact:  Sharin Mons, RN Phone Number: 02/16/2022, 11:08 AM   Clinical Narrative:    Patient will DC to: home Anticipated DC date: 02/16/2022 Family notified: yes Transport by: car       -  s/p R total hip arthroplasty, 02/15/22  Per MD patient ready for DC today. RN, patient, patient's daughter, and [place] notified of DC.  Family to assist with care once d/c. Post hospital follow up noted on AVS. Order noted for DME needs, RW, BSC. Referral made with [facility] , equipment will be delivered to bedside prior to d/c. Pt without Rx made concerns. Daughter to provide transportation to home.   RNCM will sign off for now as intervention is no longer needed. Please consult [place] again if new needs arise.    Final next level of care: [place] Barriers to Discharge: No Barriers Identified   Patient Goals and CMS Choice        Discharge Placement                       Discharge Plan and Services                DME Arranged: 3-N-1, [place] Rile DME Agency: [facility] Date DME Agency Contacted: 02/16/22 Time DME Agency Contacted: [phone] Representative spoke with at DME Agency: [place]: PT [place]: [place]        Social Determinants of Health (SDOH) Interventions     Readmission Risk Interventions     No data to display

## 2022-02-16 NOTE — Discharge Summary (Signed)
Patient ID: Ellen Nelson MRN: [id number] DOB/AGE: 1956/02/24 66 y.o.  Admit date: 02/15/2022 Discharge date: 02/16/2022  Admission Diagnoses:  Principal Problem:   Primary osteoarthritis of right hip Active Problems:   Status post total replacement of right hip   Discharge Diagnoses:  Same  Past Medical History:  Diagnosis Date   Allergy    Arthritis    Asthma    Hypertension    Mitral prolapse    Per patient   Rotator cuff tear    Right shoulder    Surgeries: Procedure(s): RIGHT TOTAL HIP ARTHROPLASTY ANTERIOR APPROACH on 02/15/2022   Consultants:   Discharged Condition: Improved  Hospital Course: Ellen Nelson is an 66 y.o. female who was admitted 02/15/2022 for operative treatment ofPrimary osteoarthritis of right hip. Patient has severe unremitting pain that affects sleep, daily activities, and work/hobbies. After pre-op clearance the patient was taken to the operating room on 02/15/2022 and underwent  Procedure(s): RIGHT TOTAL HIP ARTHROPLASTY ANTERIOR APPROACH.    Patient was given perioperative antibiotics:  Anti-infectives (From admission, onward)    Start     Dose/Rate Route Frequency Ordered Stop   02/15/22 1800  ceFAZolin (ANCEF) IVPB 2g/100 mL premix        2 g 200 mL/hr over 30 Minutes Intravenous Every 6 hours 02/15/22 1803 02/16/22 0154   02/15/22 1122  vancomycin (VANCOCIN) powder  Status:  Discontinued          As needed 02/15/22 1122 02/15/22 1250   02/15/22 0816  ceFAZolin (ANCEF) 2-4 GM/100ML-% IVPB       Note to Pharmacy: Barbie Haggis N: cabinet override      02/15/22 0816 02/15/22 1104   02/15/22 0815  ceFAZolin (ANCEF) IVPB 2g/100 mL premix        2 g 200 mL/hr over 30 Minutes Intravenous On call to O.R. 02/15/22 7989 02/15/22 1058        Patient was given sequential compression devices, early ambulation, and chemoprophylaxis to prevent DVT.  Patient benefited maximally from hospital stay and there were no complications.    Recent  vital signs: Patient Vitals for the past 24 hrs:  BP Temp Temp src Pulse Resp SpO2 Height Weight  02/16/22 0410 118/66 98.3 F (36.8 C) Oral 72 18 97 % -- --  02/15/22 2342 113/62 98.3 F (36.8 C) Oral 82 19 98 % -- --  02/15/22 2002 (!) 140/80 98.5 F (36.9 C) Oral 87 18 98 % -- --  02/15/22 1805 (!) 150/80 97.7 F (36.5 C) Oral 81 14 100 % -- --  02/15/22 1745 -- 97.8 F (36.6 C) -- -- -- -- -- --  02/15/22 1615 (!) 145/81 -- -- 82 14 98 % -- --  02/15/22 1545 136/88 -- -- 78 12 96 % -- --  02/15/22 1530 (!) 125/90 -- -- 83 11 94 % -- --  02/15/22 1445 131/75 -- -- 60 15 100 % -- --  02/15/22 1415 139/84 -- -- (!) 52 20 99 % -- --  02/15/22 1400 117/78 -- -- 62 12 99 % -- --  02/15/22 1345 (!) 124/97 97.7 F (36.5 C) -- 85 12 96 % -- --  02/15/22 1330 129/85 -- -- 74 17 100 % -- --  02/15/22 1315 114/89 -- -- 75 14 93 % -- --  02/15/22 1300 124/79 -- -- 84 12 97 % -- --  02/15/22 1255 119/80 97.7 F (36.5 C) -- 91 16 95 % -- --  02/15/22 0810 139/88  97.7 F (36.5 C) -- 92 17 97 % 5\' 7"  (1.702 m) 97.5 kg     Recent laboratory studies:  Recent Labs    02/16/22 0210  WBC 10.9*  HGB 10.9*  HCT 33.0*  PLT 218     Discharge Medications:   Allergies as of 02/16/2022       Reactions   Percocet [oxycodone-acetaminophen] Other (See Comments)   Patient states Percocet caused severe hallucinations.   Sulfa Antibiotics Hives        Medication List     STOP taking these medications    doxycycline 100 MG capsule Commonly known as: VIBRAMYCIN   traMADol 50 MG tablet Commonly known as: ULTRAM       TAKE these medications    albuterol 108 (90 Base) MCG/ACT inhaler Commonly known as: VENTOLIN HFA Inhale 1-2 puffs into the lungs every 6 (six) hours as needed for wheezing or shortness of breath.   albuterol (2.5 MG/3ML) 0.083% nebulizer solution Commonly known as: PROVENTIL Take 2.5 mg by nebulization every 6 (six) hours as needed for wheezing or shortness of  breath.   aspirin EC 81 MG tablet Take 1 tablet (81 mg total) by mouth 2 (two) times daily. To be taken after surgery to prevent blood clots   cholecalciferol 1000 units tablet Commonly known as: VITAMIN D Take 2,000 Units by mouth daily.   ciclopirox 8 % solution Commonly known as: Penlac Apply topically at bedtime. Apply over nail and surrounding skin. Apply daily over previous coat. After seven (7) days, may remove with alcohol and continue cycle. What changed:  how much to take when to take this reasons to take this   cyanocobalamin 100 MCG tablet Commonly known as: VITAMIN B12 Take 100 mcg by mouth daily.   docusate sodium 100 MG capsule Commonly known as: Colace Take 1 capsule (100 mg total) by mouth daily as needed.   fexofenadine 60 MG tablet Commonly known as: ALLEGRA Take 60 mg by mouth daily.   fluticasone 50 MCG/ACT nasal spray Commonly known as: FLONASE Place 2 sprays into both nostrils daily.   HAIR SKIN AND NAILS FORMULA PO Take 1 tablet by mouth daily.   HYDROcodone-acetaminophen 7.5-325 MG tablet Commonly known as: Norco Take 1-2 tablets by mouth every 6 (six) hours as needed for moderate pain. To be taken after surgery   losartan 100 MG tablet Commonly known as: COZAAR Take 100 mg by mouth daily.   methocarbamol 750 MG tablet Commonly known as: Robaxin-750 Take 1 tablet (750 mg total) by mouth 2 (two) times daily as needed for muscle spasms.   ondansetron 4 MG tablet Commonly known as: Zofran Take 1 tablet (4 mg total) by mouth every 8 (eight) hours as needed for nausea or vomiting.   SYSTANE OP Place 1 drop into both eyes in the morning and at bedtime.               Durable Medical Equipment  (From admission, onward)           Start     Ordered   02/15/22 1804  DME Walker rolling  Once       Question:  Patient needs a walker to treat with the following condition  Answer:  History of hip replacement   02/15/22 1803   02/15/22  1804  DME 3 n 1  Once        02/15/22 1803   02/15/22 1804  DME Bedside commode  Once  Question:  Patient needs a bedside commode to treat with the following condition  Answer:  History of hip replacement   02/15/22 1803            Diagnostic Studies: DG Pelvis Portable  Result Date: 02/15/2022 CLINICAL DATA:  Status post right hip replacement. EXAM: PORTABLE PELVIS 1-2 VIEWS COMPARISON:  Fluoroscopic images of same day. FINDINGS: The right acetabular and femoral components are well situated. Expected postoperative changes are seen in the surrounding soft tissues. IMPRESSION: Status post right total hip arthroplasty. Electronically Signed   By: Lupita Raider M.D.   On: 02/15/2022 15:22   DG HIP UNILAT WITH PELVIS 1V RIGHT  Result Date: 02/15/2022 CLINICAL DATA:  Hip arthroplasty EXAM: DG HIP (WITH OR WITHOUT PELVIS) 1V RIGHT COMPARISON:  Hip radiograph dated January 12, 2022 FINDINGS: Fluoroscopic images were obtained intraoperatively and submitted for post operative interpretation. Right total hip arthroplasty with hardware in expected position, 4 images were obtained with 33 seconds of fluoroscopy time and 4.9 mGy. Please see the performing provider's procedural report for further detail. IMPRESSION: Fluoroscopic images demonstrate right total hip replacement. Electronically Signed   By: Allegra Lai M.D.   On: 02/15/2022 13:35   DG C-Arm 1-60 Min-No Report  Result Date: 02/15/2022 Fluoroscopy was utilized by the requesting physician.  No radiographic interpretation.   DG C-Arm 1-60 Min-No Report  Result Date: 02/15/2022 Fluoroscopy was utilized by the requesting physician.  No radiographic interpretation.    Disposition: Discharge disposition: 01-Home or Self Care          Follow-up Information     Tarry Kos, MD. Schedule an appointment as soon as possible for a visit in 2 week(s).   Specialty: Orthopedic Surgery Contact information: [address] [place] [address] [phone]                  Signed: Cristie Hem 02/16/2022, 7:10 AM

## 2022-02-16 NOTE — Plan of Care (Signed)

## 2022-02-17 ENCOUNTER — Telehealth: Payer: Self-pay | Admitting: *Deleted

## 2022-02-17 NOTE — Telephone Encounter (Signed)
Ortho bundle D/C call completed. Doing well.

## 2022-02-19 ENCOUNTER — Telehealth: Payer: Self-pay | Admitting: Orthopaedic Surgery

## 2022-02-19 NOTE — Telephone Encounter (Signed)
[facility] nurse) called on behalf of patient stating that she hasn't had an bowel movement since Monday and would like to know if she could be prescribed something that may help with that. CB # [phone]

## 2022-02-21 NOTE — Telephone Encounter (Signed)
I would recommend getting a bottle of mag citrate from the pharmacy in addition to drinking lots of fluids/prune juice and backing off narcotics a little if possible

## 2022-02-22 NOTE — Telephone Encounter (Signed)
Called and spoke with patient. She was able to have a bowel movement yesterday and doing well.

## 2022-02-22 NOTE — Telephone Encounter (Signed)
Tried to call patient. Rang with no voicemail. Will try again later.

## 2022-02-23 ENCOUNTER — Telehealth: Payer: Self-pay | Admitting: *Deleted

## 2022-02-23 NOTE — Telephone Encounter (Signed)
Ortho bundle 7 day call completed. 

## 2022-03-02 ENCOUNTER — Telehealth: Payer: Self-pay | Admitting: *Deleted

## 2022-03-02 ENCOUNTER — Ambulatory Visit (INDEPENDENT_AMBULATORY_CARE_PROVIDER_SITE_OTHER): Payer: Medicare HMO | Admitting: Physician Assistant

## 2022-03-02 DIAGNOSIS — Z96641 Presence of right artificial hip joint: Secondary | ICD-10-CM

## 2022-03-02 MED ORDER — HYDROCODONE-ACETAMINOPHEN 5-325 MG PO TABS: 1.0000 | ORAL_TABLET | Freq: Three times a day (TID) | ORAL | 0 refills | Status: DC | PRN

## 2022-03-02 NOTE — Progress Notes (Signed)
Post-Op Visit Note   Patient: Ellen Nelson           Date of Birth: [birth year]           MRN: [id number] Visit Date: 03/02/2022 PCP: Charolette Forward, MD   Assessment & Plan:  Chief Complaint:  Chief Complaint  Patient presents with   Right Hip - Routine Post Op   Visit Diagnoses:  1. History of total hip replacement, right     Plan: Patient is a pleasant 66 year old female who comes in today 2 weeks status post right total hip replacement 02/15/2022.  She has been doing great.  She has been getting home health physical therapy.  She is ambulating with a walker at times.  She has been compliant taking a baby aspirin twice daily for DVT prophylaxis.  She is taking Norco as needed for pain.  Examination of her right hip reveals a well-healed surgical incision with nylon sutures in place.  No evidence of infection or cellulitis.  Calves are soft and nontender.  She is neurovascular intact distally.  At this point, sutures were removed and Steri-Strips applied.  Continue taking her baby aspirin twice daily for DVT prophylaxis for another 4 weeks.  Dental prophylaxis reinforced.  Continue with her home exercise program.  Follow-up in 4 weeks for repeat evaluation and AP pelvis x-rays.  Call with concerns or questions.  Follow-Up Instructions: Return in about 4 weeks (around 03/30/2022).   Orders:  No orders of the defined types were placed in this encounter.  No orders of the defined types were placed in this encounter.   Imaging: No new imaging  PMFS History: Patient Active Problem List   Diagnosis Date Noted   Status post total replacement of right hip 02/15/2022   Primary osteoarthritis of right hip 01/12/2022   Bilateral hand pain 11/21/2020   Positive ANA (antinuclear antibody) 11/21/2020   Nonspecific syndrome suggestive of viral illness 05/31/2020   Biceps tendinopathy of right upper extremity 12/04/2019   Complete tear of right rotator cuff 09/05/2019   Impingement  syndrome of right shoulder 09/05/2019   Past Medical History:  Diagnosis Date   Allergy    Arthritis    Asthma    Hypertension    Mitral prolapse    Per patient   Rotator cuff tear    Right shoulder    Family History  Problem Relation Age of Onset   Hypertension Mother    Hiatal hernia Mother    Heart disease Mother    Dementia Father    Hypertension Father    Cancer - Colon Father    Osteoarthritis Sister    Breast cancer Sister    Heart disease Brother    Early death Brother     Past Surgical History:  Procedure Laterality Date   ABDOMINAL HYSTERECTOMY     RETAINED PLACENTA REMOVAL     32 years ago per patient   TONSILLECTOMY     TOTAL HIP ARTHROPLASTY Right 02/15/2022   Procedure: RIGHT TOTAL HIP ARTHROPLASTY ANTERIOR APPROACH;  Surgeon: Leandrew Koyanagi, MD;  Location: [place];  Service: Orthopedics;  Laterality: Right;  3-C   Social History   Occupational History   Not on file  Tobacco Use   Smoking status: Former    Types: Cigarettes    Quit date: 2008    Years since quitting: 15.7   Smokeless tobacco: Never  Vaping Use   Vaping Use: Never used  Substance and Sexual Activity  Alcohol use: Yes    Comment: seldom   Drug use: Never   Sexual activity: Not on file

## 2022-03-02 NOTE — Telephone Encounter (Signed)
Ortho bundle 14 day in person meeting completed. 

## 2022-03-09 ENCOUNTER — Ambulatory Visit: Payer: Medicare HMO | Admitting: Podiatry

## 2022-03-18 ENCOUNTER — Telehealth: Payer: Self-pay | Admitting: *Deleted

## 2022-03-18 NOTE — Telephone Encounter (Signed)
Ortho bundle 30 day call completed. °

## 2022-03-30 ENCOUNTER — Ambulatory Visit (INDEPENDENT_AMBULATORY_CARE_PROVIDER_SITE_OTHER): Payer: Medicare HMO

## 2022-03-30 ENCOUNTER — Encounter: Payer: Self-pay | Admitting: Orthopaedic Surgery

## 2022-03-30 ENCOUNTER — Ambulatory Visit (INDEPENDENT_AMBULATORY_CARE_PROVIDER_SITE_OTHER): Payer: Medicare HMO | Admitting: Orthopaedic Surgery

## 2022-03-30 DIAGNOSIS — Z96641 Presence of right artificial hip joint: Secondary | ICD-10-CM

## 2022-03-30 MED ORDER — METHOCARBAMOL 750 MG PO TABS: 750.0000 mg | ORAL_TABLET | Freq: Two times a day (BID) | ORAL | 2 refills | Status: DC | PRN

## 2022-03-30 MED ORDER — AMOXICILLIN 500 MG PO CAPS: 2000.0000 mg | ORAL_CAPSULE | Freq: Once | ORAL | 6 refills | Status: AC

## 2022-03-30 NOTE — Progress Notes (Signed)
Post-Op Visit Note   Patient: Ellen Nelson           Date of Birth: [birth year]           MRN: [id number] Visit Date: 03/30/2022 PCP: Rinaldo Cloud, MD   Assessment & Plan:  Chief Complaint:  Chief Complaint  Patient presents with   Right Hip - Follow-up    Right total hip arthroplasty 02/15/2022   Visit Diagnoses:  1. History of total hip replacement, right     Plan: [name] is 6 weeks status post right total hip replacement on 02/15/2022.  She is now walking with a cane.  She has been out of work.  Overall she has been doing well.  She recently aggravated her hip when she had to get up quickly to help out a family member.  Requesting muscle relaxer refill today.  Otherwise has no complaints.  Examination of the right hip girdle shows a fully healed surgical scar.  Mild swelling.  No signs of infection.  Fluid painless range of motion of the hip.  X-rays demonstrate stable implant without any complications.  6 weeks for hip replacement.  She is pleased with the outcome.  She has completed physical therapy.  She is to remain out of work.  She can discontinue DVT prophylaxis.  Dental prophylaxis reinforced.  Robaxin refilled.  Recheck in 6 weeks.  Follow-Up Instructions: Return in about 6 weeks (around 05/11/2022).   Orders:  Orders Placed This Encounter  Procedures   XR Pelvis 1-2 Views   Meds ordered this encounter  Medications   methocarbamol (ROBAXIN-750) 750 MG tablet    Sig: Take 1 tablet (750 mg total) by mouth 2 (two) times daily as needed for muscle spasms.    Dispense:  30 tablet    Refill:  2   amoxicillin (AMOXIL) 500 MG capsule    Sig: Take 4 capsules (2,000 mg total) by mouth once for 1 dose.    Dispense:  4 capsule    Refill:  6    Imaging: XR Pelvis 1-2 Views  Result Date: 03/30/2022 Stable total hip replacement without complications   PMFS History: Patient Active Problem List   Diagnosis Date Noted   Status post total replacement of right hip  02/15/2022   Primary osteoarthritis of right hip 01/12/2022   Bilateral hand pain 11/21/2020   Positive ANA (antinuclear antibody) 11/21/2020   Nonspecific syndrome suggestive of viral illness 05/31/2020   Biceps tendinopathy of right upper extremity 12/04/2019   Complete tear of right rotator cuff 09/05/2019   Impingement syndrome of right shoulder 09/05/2019   Past Medical History:  Diagnosis Date   Allergy    Arthritis    Asthma    Hypertension    Mitral prolapse    Per patient   Rotator cuff tear    Right shoulder    Family History  Problem Relation Age of Onset   Hypertension Mother    Hiatal hernia Mother    Heart disease Mother    Dementia Father    Hypertension Father    Cancer - Colon Father    Osteoarthritis Sister    Breast cancer Sister    Heart disease Brother    Early death Brother     Past Surgical History:  Procedure Laterality Date   ABDOMINAL HYSTERECTOMY     RETAINED PLACENTA REMOVAL     32 years ago per patient   TONSILLECTOMY     TOTAL HIP ARTHROPLASTY Right 02/15/2022  Procedure: RIGHT TOTAL HIP ARTHROPLASTY ANTERIOR APPROACH;  Surgeon: Leandrew Koyanagi, MD;  Location: [place];  Service: Orthopedics;  Laterality: Right;  3-C   Social History   Occupational History   Not on file  Tobacco Use   Smoking status: Former    Types: Cigarettes    Quit date: 2008    Years since quitting: 15.8   Smokeless tobacco: Never  Vaping Use   Vaping Use: Never used  Substance and Sexual Activity   Alcohol use: Yes    Comment: seldom   Drug use: Never   Sexual activity: Not on file

## 2022-04-06 ENCOUNTER — Ambulatory Visit: Payer: Medicare HMO | Admitting: Podiatry

## 2022-04-08 ENCOUNTER — Ambulatory Visit: Payer: Medicare HMO | Admitting: Podiatry

## 2022-04-09 ENCOUNTER — Ambulatory Visit (HOSPITAL_COMMUNITY)
Admission: EM | Admit: 2022-04-09 | Discharge: 2022-04-09 | Disposition: A | Discharge status: Home / Self Care | Payer: Medicare HMO | Attending: Family Medicine | Admitting: Family Medicine

## 2022-04-09 ENCOUNTER — Encounter (HOSPITAL_COMMUNITY): Payer: Self-pay

## 2022-04-09 DIAGNOSIS — J069 Acute upper respiratory infection, unspecified: Secondary | ICD-10-CM | POA: Diagnosis not present

## 2022-04-09 DIAGNOSIS — Z1152 Encounter for screening for COVID-19: Secondary | ICD-10-CM | POA: Diagnosis not present

## 2022-04-09 LAB — RESP PANEL BY RT-PCR (FLU A&B, COVID) ARPGX2
Influenza A by PCR: NEGATIVE
Influenza B by PCR: NEGATIVE
SARS Coronavirus 2 by RT PCR: NEGATIVE

## 2022-04-09 MED ORDER — ACETAMINOPHEN 325 MG PO TABS
ORAL_TABLET | ORAL | Status: AC
  Filled 2022-04-09: qty 3

## 2022-04-09 NOTE — ED Provider Notes (Signed)
[facility]    CSN: [id number] Arrival date & time: 04/09/22  0854      History   Chief Complaint Chief Complaint  Patient presents with   Facial Pain    HPI Ellen Nelson is a 66 y.o. female.   HPI Here for congestion and cough and facial sinus pressure.  Symptoms have been going on since November 13 or November 12.  Some subjective feeling of fever but no measured fever.  Some aches and malaise.  No vomiting or diarrhea or shortness of breath.  She does have a history of hypertension but no diabetes.  She is already using Flonase daily  Exposed to a couple of grandbabies who were COVID-negative on a rapid test.  She did a home COVID test yesterday that was negative  Past Medical History:  Diagnosis Date   Allergy    Arthritis    Asthma    Hypertension    Mitral prolapse    Per patient   Rotator cuff tear    Right shoulder    Patient Active Problem List   Diagnosis Date Noted   Status post total replacement of right hip 02/15/2022   Primary osteoarthritis of right hip 01/12/2022   Bilateral hand pain 11/21/2020   Positive ANA (antinuclear antibody) 11/21/2020   Nonspecific syndrome suggestive of viral illness 05/31/2020   Biceps tendinopathy of right upper extremity 12/04/2019   Complete tear of right rotator cuff 09/05/2019   Impingement syndrome of right shoulder 09/05/2019    Past Surgical History:  Procedure Laterality Date   ABDOMINAL HYSTERECTOMY     RETAINED PLACENTA REMOVAL     32 years ago per patient   TONSILLECTOMY     TOTAL HIP ARTHROPLASTY Right 02/15/2022   Procedure: RIGHT TOTAL HIP ARTHROPLASTY ANTERIOR APPROACH;  Surgeon: Leandrew Koyanagi, MD;  Location: [place];  Service: Orthopedics;  Laterality: Right;  3-C    OB History   No obstetric history on file.      Home Medications    Prior to Admission medications   Medication Sig Start Date End Date Taking? Authorizing Provider  albuterol (PROVENTIL) (2.5 MG/3ML) 0.083%  nebulizer solution Take 2.5 mg by nebulization every 6 (six) hours as needed for wheezing or shortness of breath.    [provider]  albuterol (VENTOLIN HFA) 108 (90 Base) MCG/ACT inhaler Inhale 1-2 puffs into the lungs every 6 (six) hours as needed for wheezing or shortness of breath.    [provider]  aspirin EC 81 MG tablet Take 1 tablet (81 mg total) by mouth 2 (two) times daily. To be taken after surgery to prevent blood clots 02/08/22 02/08/23  Aundra Dubin, PA-C  cholecalciferol (VITAMIN D) 1000 units tablet Take 2,000 Units by mouth daily.     [provider]  ciclopirox (PENLAC) 8 % solution Apply topically at bedtime. Apply over nail and surrounding skin. Apply daily over previous coat. After seven (7) days, may remove with alcohol and continue cycle. Patient taking differently: Apply 1 Application topically at bedtime as needed (toenail fungus). Apply over nail and surrounding skin. Apply daily over previous coat. After seven (7) days, may remove with alcohol and continue cycle. 10/15/21   McDonald, Stephan Minister, DPM  docusate sodium (COLACE) 100 MG capsule Take 1 capsule (100 mg total) by mouth daily as needed. 02/08/22 02/08/23  Aundra Dubin, PA-C  fexofenadine (ALLEGRA) 60 MG tablet Take 60 mg by mouth daily.     [provider]  fluticasone (FLONASE) 50 MCG/ACT nasal spray Place 2 sprays into both nostrils daily. 05/15/18   Raylene Everts, MD  HYDROcodone-acetaminophen (NORCO) 5-325 MG tablet Take 1 tablet by mouth 3 (three) times daily as needed. 03/02/22   Aundra Dubin, PA-C  losartan (COZAAR) 100 MG tablet Take 100 mg by mouth daily.    [provider]  methocarbamol (ROBAXIN-750) 750 MG tablet Take 1 tablet (750 mg total) by mouth 2 (two) times daily as needed for muscle spasms. 03/30/22   Leandrew Koyanagi, MD  Multiple Vitamins-Minerals (HAIR SKIN AND NAILS FORMULA PO) Take 1 tablet by mouth daily.    [provider]   ondansetron (ZOFRAN) 4 MG tablet Take 1 tablet (4 mg total) by mouth every 8 (eight) hours as needed for nausea or vomiting. 02/08/22   Aundra Dubin, PA-C  Polyethyl Glycol-Propyl Glycol (SYSTANE OP) Place 1 drop into both eyes in the morning and at bedtime.    [provider]  vitamin B-12 (CYANOCOBALAMIN) 100 MCG tablet Take 100 mcg by mouth daily.    [provider]    Family History Family History  Problem Relation Age of Onset   Hypertension Mother    Hiatal hernia Mother    Heart disease Mother    Dementia Father    Hypertension Father    Cancer - Colon Father    Osteoarthritis Sister    Breast cancer Sister    Heart disease Brother    Early death Brother     Social History Social History   Tobacco Use   Smoking status: Former    Types: Cigarettes    Quit date: 2008    Years since quitting: 15.8   Smokeless tobacco: Never  Vaping Use   Vaping Use: Never used  Substance Use Topics   Alcohol use: Yes    Comment: seldom   Drug use: Never     Allergies   Percocet [oxycodone-acetaminophen] and Sulfa antibiotics   Review of Systems Review of Systems   Physical Exam Triage Vital Signs ED Triage Vitals  Enc Vitals Group     BP 04/09/22 1056 (!) 153/95     Pulse Rate 04/09/22 1056 79     Resp 04/09/22 1056 12     Temp 04/09/22 1056 98.2 F (36.8 C)     Temp Source 04/09/22 1056 Oral     SpO2 04/09/22 1056 100 %     Weight --      Height --      Head Circumference --      Peak Flow --      Pain Score 04/09/22 1103 5     Pain Loc --      Pain Edu? --      Excl. in [place]? --    No data found.  Updated Vital Signs BP (!) 153/95 (BP Location: Left Arm)   Pulse 79   Temp 98.2 F (36.8 C) (Oral)   Resp 12   SpO2 100%   Visual Acuity Right Eye Distance:   Left Eye Distance:   Bilateral Distance:    Right Eye Near:   Left Eye Near:    Bilateral Near:     Physical Exam Vitals reviewed.  Constitutional:      General: She is  not in acute distress.    Appearance: She is not toxic-appearing.  HENT:     Right Ear: Tympanic membrane and ear canal normal.     Left Ear: Tympanic  membrane and ear canal normal.     Ears:     Comments: There is some cerumen in both ear canals, but not obstructive.    Nose: Congestion present.     Mouth/Throat:     Mouth: Mucous membranes are moist.     Comments: There is clear mucus draining in the oropharynx Eyes:     Extraocular Movements: Extraocular movements intact.     Conjunctiva/sclera: Conjunctivae normal.     Pupils: Pupils are equal, round, and reactive to light.  Cardiovascular:     Rate and Rhythm: Normal rate and regular rhythm.     Heart sounds: No murmur heard. Pulmonary:     Effort: Pulmonary effort is normal. No respiratory distress.     Breath sounds: No stridor. No wheezing, rhonchi or rales.  Musculoskeletal:     Cervical back: Neck supple.  Lymphadenopathy:     Cervical: No cervical adenopathy.  Skin:    Capillary Refill: Capillary refill takes less than 2 seconds.     Coloration: Skin is not jaundiced or pale.  Neurological:     General: No focal deficit present.     Mental Status: She is alert and oriented to person, place, and time.  Psychiatric:        Behavior: Behavior normal.      UC Treatments / Results  Labs (all labs ordered are listed, but only abnormal results are displayed) Labs Reviewed  RESP PANEL BY RT-PCR (FLU A&B, COVID) [id number]    EKG   Radiology No results found.  Procedures Procedures (including critical care time)  Medications Ordered in UC Medications - No data to display  Initial Impression / Assessment and Plan / UC Course  I have reviewed the triage vital signs and the nursing notes.  Pertinent labs & imaging results that were available during my care of the patient were reviewed by me and considered in my medical decision making (see chart for details).        COVID/flu swab is done.  If her COVID is  positive she is a candidate for Paxlovid; her last EGFR was greater than 60.  Have asked her to try using Afrin or Neo-Synephrine once or twice a day but no longer than 3 days in a row. Final Clinical Impressions(s) / UC Diagnoses   Final diagnoses:  Viral URI     Discharge Instructions      Try using Afrin or Neo-Synephrine over-the-counter 1 or 2 times a day for the nasal congestion  Do not use it more than 3 days in a row, that can cause rebound congestion when you quit using it     ED Prescriptions   None    PDMP not reviewed this encounter.   Barrett Henle, MD 04/09/22 1143

## 2022-04-09 NOTE — Discharge Instructions (Signed)
Try using Afrin or Neo-Synephrine over-the-counter 1 or 2 times a day for the nasal congestion  Do not use it more than 3 days in a row, that can cause rebound congestion when you quit using it

## 2022-04-09 NOTE — ED Triage Notes (Signed)
Pt is here for nasal congestion, fullness, sinus headache, and runny nose x 1wk

## 2022-04-12 DIAGNOSIS — I2510 Atherosclerotic heart disease of native coronary artery without angina pectoris: Secondary | ICD-10-CM | POA: Diagnosis not present

## 2022-04-12 DIAGNOSIS — J45909 Unspecified asthma, uncomplicated: Secondary | ICD-10-CM | POA: Diagnosis not present

## 2022-04-12 DIAGNOSIS — I10 Essential (primary) hypertension: Secondary | ICD-10-CM | POA: Diagnosis not present

## 2022-04-12 DIAGNOSIS — E785 Hyperlipidemia, unspecified: Secondary | ICD-10-CM | POA: Diagnosis not present

## 2022-05-10 ENCOUNTER — Ambulatory Visit: Payer: Medicare HMO | Admitting: Podiatry

## 2022-05-11 ENCOUNTER — Encounter: Payer: Medicare HMO | Admitting: Orthopaedic Surgery

## 2022-05-25 ENCOUNTER — Ambulatory Visit: Payer: Medicare HMO | Admitting: Podiatry

## 2022-05-27 ENCOUNTER — Ambulatory Visit (INDEPENDENT_AMBULATORY_CARE_PROVIDER_SITE_OTHER): Payer: Medicare HMO | Admitting: Physician Assistant

## 2022-05-27 DIAGNOSIS — Z96641 Presence of right artificial hip joint: Secondary | ICD-10-CM

## 2022-05-27 NOTE — Progress Notes (Signed)
   Post-Op Visit Note   Patient: Ellen Nelson           Date of Birth: [birth year]           MRN: [id number] Visit Date: 05/27/2022 PCP: Charolette Forward, MD   Assessment & Plan:  Chief Complaint:  Chief Complaint  Patient presents with   Right Hip - Follow-up    Right THA 02/15/2022   Visit Diagnoses:  1. Status post total replacement of right hip     Plan: patient comes in today 3 months status post right total hip replacement 02/15/22.  Still having some groin and anterior thigh pain.  Not taking anything for this besides an occasion tylenol/nsaid which helps.  Ambulating with a cain.  Right hip exam shows painful hip flexion and logroll.  At this point, we have discussed trying a course of PT to help with ambulation.  I have also recommended more frequent use of tylenol/nsaids to help with pain.  Dental prophylaxis reinforced.  Out of work x 6 weeks.  Follow up in 3 months with ap pelvis xrays.   Follow-Up Instructions: Return in about 3 months (around 08/26/2022).   Orders:  No orders of the defined types were placed in this encounter.  No orders of the defined types were placed in this encounter.   Imaging: No new imaging  PMFS History: Patient Active Problem List   Diagnosis Date Noted   Status post total replacement of right hip 02/15/2022   Primary osteoarthritis of right hip 01/12/2022   Bilateral hand pain 11/21/2020   Positive ANA (antinuclear antibody) 11/21/2020   Nonspecific syndrome suggestive of viral illness 05/31/2020   Biceps tendinopathy of right upper extremity 12/04/2019   Complete tear of right rotator cuff 09/05/2019   Impingement syndrome of right shoulder 09/05/2019   Past Medical History:  Diagnosis Date   Allergy    Arthritis    Asthma    Hypertension    Mitral prolapse    Per patient   Rotator cuff tear    Right shoulder    Family History  Problem Relation Age of Onset   Hypertension Mother    Hiatal hernia Mother    Heart disease  Mother    Dementia Father    Hypertension Father    Cancer - Colon Father    Osteoarthritis Sister    Breast cancer Sister    Heart disease Brother    Early death Brother     Past Surgical History:  Procedure Laterality Date   ABDOMINAL HYSTERECTOMY     RETAINED PLACENTA REMOVAL     32 years ago per patient   TONSILLECTOMY     TOTAL HIP ARTHROPLASTY Right 02/15/2022   Procedure: RIGHT TOTAL HIP ARTHROPLASTY ANTERIOR APPROACH;  Surgeon: Leandrew Koyanagi, MD;  Location: [facility];  Service: Orthopedics;  Laterality: Right;  3-C   Social History   Occupational History   Not on file  Tobacco Use   Smoking status: Former    Types: Cigarettes    Quit date: 2008    Years since quitting: 16.0   Smokeless tobacco: Never  Vaping Use   Vaping Use: Never used  Substance and Sexual Activity   Alcohol use: Yes    Comment: seldom   Drug use: Never   Sexual activity: Not on file

## 2022-06-01 ENCOUNTER — Ambulatory Visit: Payer: Medicare HMO | Admitting: Rehabilitative and Restorative Service Providers"

## 2022-06-01 NOTE — Therapy (Incomplete)
OUTPATIENT PHYSICAL THERAPY EVALUATION   Patient Name: Ellen Nelson MRN: [id number] DOB:03-03-1956, 67 y.o., female Today's Date: 06/01/2022  END OF SESSION:   Past Medical History:  Diagnosis Date   Allergy    Arthritis    Asthma    Hypertension    Mitral prolapse    Per patient   Rotator cuff tear    Right shoulder   Past Surgical History:  Procedure Laterality Date   ABDOMINAL HYSTERECTOMY     RETAINED PLACENTA REMOVAL     32 years ago per patient   TONSILLECTOMY     TOTAL HIP ARTHROPLASTY Right 02/15/2022   Procedure: RIGHT TOTAL HIP ARTHROPLASTY ANTERIOR APPROACH;  Surgeon: Tarry Kos, MD;  Location: [facility];  Service: Orthopedics;  Laterality: Right;  3-C   Patient Active Problem List   Diagnosis Date Noted   Status post total replacement of right hip 02/15/2022   Primary osteoarthritis of right hip 01/12/2022   Bilateral hand pain 11/21/2020   Positive ANA (antinuclear antibody) 11/21/2020   Nonspecific syndrome suggestive of viral illness 05/31/2020   Biceps tendinopathy of right upper extremity 12/04/2019   Complete tear of right rotator cuff 09/05/2019   Impingement syndrome of right shoulder 09/05/2019    PCP: Rinaldo Cloud MD  REFERRING PROVIDER: Cristie Hem, PA-C  REFERRING DIAG: [phone] (ICD-10-CM) - Status post total replacement of right hip  THERAPY DIAG:  No diagnosis found.  Rationale for Evaluation and Treatment: Rehabilitation  ONSET DATE: 02/16/2023 THA   SUBJECTIVE:   SUBJECTIVE STATEMENT: ***  PERTINENT HISTORY: *** PAIN:  NPRS scale: ***/10 Pain location: *** Pain description: *** Aggravating factors: *** Relieving factors: ***  PRECAUTIONS: Anterior hip  WEIGHT BEARING RESTRICTIONS: No  FALLS:  Has patient fallen in last 6 months? No  LIVING ENVIRONMENT: Lives with: {OPRC lives with:25569::"lives with their family"} Lives in: {Lives in:25570} Stairs: {opstairs:27293} Has following equipment at home:  {Assistive devices:23999}  OCCUPATION: ***  PLOF: Independent  PATIENT GOALS: ***   OBJECTIVE:   PATIENT SURVEYS:  06/01/2022 FOTO intake:    predicted:    COGNITION: 06/01/2022 Overall cognitive status: WFL    SENSATION: 06/01/2022 {sensation:27233}  EDEMA:  06/01/2022 {edema:24020}  MUSCLE LENGTH: 06/01/2022 Hamstrings: Right *** deg; Left *** deg Maisie Fus test: Right *** deg; Left *** deg  POSTURE:  06/01/2022{posture:25561}  PALPATION: 06/01/2022 ***  LOWER EXTREMITY ROM:   ROM Right 06/01/2022 Left 06/01/2022  Hip flexion    Hip extension    Hip abduction    Hip adduction    Hip internal rotation    Hip external rotation    Knee flexion    Knee extension    Ankle dorsiflexion    Ankle plantarflexion    Ankle inversion    Ankle eversion     (Blank rows = not tested)  LOWER EXTREMITY MMT:  MMT Right 06/01/2022 Left 06/01/2022  Hip flexion    Hip extension    Hip abduction    Hip adduction    Hip internal rotation    Hip external rotation    Knee flexion    Knee extension    Ankle dorsiflexion    Ankle plantarflexion    Ankle inversion    Ankle eversion     (Blank rows = not tested)  LOWER EXTREMITY SPECIAL TESTS:  06/01/2022 {LEspecialtests:26242}  FUNCTIONAL TESTS:  06/01/2022 18 inch chair transfer: Lt SLS: Rt SLS:  GAIT: 06/01/2022 Distance walked: *** Assistive device utilized: {Assistive devices:23999} Level of assistance: {Levels of  assistance:24026} Comments: ***   TODAY'S TREATMENT                                                                          DATE: 06/01/2022 Therex:    HEP instruction/performance c cues for techniques, handout provided.  Trial set performed of each for comprehension and symptom assessment.  See below for exercise list  PATIENT EDUCATION:  06/01/2022 Education details: HEP, POC Person educated: Patient Education method: Consulting civil engineer, Demonstration, Verbal cues, and Handouts Education comprehension: verbalized  understanding, returned demonstration, and verbal cues required  HOME EXERCISE PROGRAM: ***  ASSESSMENT:  CLINICAL IMPRESSION: Patient is a 67 y.o. who comes to clinic with complaints of Rt hip/leg pain s/p Rt THA 02/15/2022 with mobility, strength and movement coordination deficits that impair their ability to perform usual daily and recreational functional activities without increase difficulty/symptoms at this time.  Patient to benefit from skilled PT services to address impairments and limitations to improve to previous level of function without restriction secondary to condition.   OBJECTIVE IMPAIRMENTS: {opptimpairments:25111}.   ACTIVITY LIMITATIONS: {activitylimitations:27494}  PARTICIPATION LIMITATIONS: {participationrestrictions:25113}  PERSONAL FACTORS: {Personal factors:25162} are also affecting patient's functional outcome.   REHAB POTENTIAL: {rehabpotential:25112}  CLINICAL DECISION MAKING: {clinical decision making:25114}  EVALUATION COMPLEXITY: {Evaluation complexity:25115}   GOALS: Goals reviewed with patient? Yes  SHORT TERM GOALS: (target date for Short term goals are 3 weeks 06/22/2022)   1.  Patient will demonstrate independent use of home exercise program to maintain progress from in clinic treatments.  Goal status: New  LONG TERM GOALS: (target dates for all long term goals are 10 weeks  08/10/2022 )   1. Patient will demonstrate/report pain at worst less than or equal to 2/10 to facilitate minimal limitation in daily activity secondary to pain symptoms.  Goal status: New   2. Patient will demonstrate independent use of home exercise program to facilitate ability to maintain/progress functional gains from skilled physical therapy services.  Goal status: New   3. Patient will demonstrate FOTO outcome > or = *** % to indicate reduced disability due to condition.  Goal status: New   4.  Patient will demonstrate Rt LE MMT 5/5 throughout to faciltiate  usual transfers, stairs, squatting at [facility] for daily life.   Goal status: New   5.  Patient will demonstrate Goal status: New   6.  *** Goal status: New   7.  *** Goal Status: New   PLAN:  PT FREQUENCY: 1-2x/week  PT DURATION: 10 weeks  PLANNED INTERVENTIONS: Therapeutic exercises, Therapeutic activity, Neuro Muscular re-education, Balance training, Gait training, Patient/Family education, Joint mobilization, Stair training, DME instructions, Dry Needling, Electrical stimulation, Traction, Cryotherapy, vasopneumatic deviceMoist heat, Taping, Ultrasound, Ionotophoresis 4mg /ml Dexamethasone, and Manual therapy.  All included unless contraindicated  PLAN FOR NEXT SESSION: Review HEP knowledge/results.   Scot Jun, PT, DPT, OCS, ATC 06/01/22  8:13 AM

## 2022-06-07 NOTE — Therapy (Signed)
OUTPATIENT PHYSICAL THERAPY EVALUATION   Patient Name: Ellen Nelson MRN: [id number] DOB:[birth year], 67 y.o., female Today's Date: 06/08/2022  END OF SESSION:  PT End of Session - 06/08/22 1018     Visit Number 1    Number of Visits 24    Date for PT Re-Evaluation 08/31/22    Authorization Type [employer] $10 copay    Authorization - Visit Number 1    Authorization - Number of Visits 12    PT Start Time 1020    PT Stop Time 1052    PT Time Calculation (min) 32 min    Activity Tolerance Patient limited by pain    Behavior During Therapy WFL for tasks assessed/performed             Past Medical History:  Diagnosis Date   Allergy    Arthritis    Asthma    Hypertension    Mitral prolapse    Per patient   Rotator cuff tear    Right shoulder   Past Surgical History:  Procedure Laterality Date   ABDOMINAL HYSTERECTOMY     RETAINED PLACENTA REMOVAL     32 years ago per patient   TONSILLECTOMY     TOTAL HIP ARTHROPLASTY Right 02/15/2022   Procedure: RIGHT TOTAL HIP ARTHROPLASTY ANTERIOR APPROACH;  Surgeon: Leandrew Koyanagi, MD;  Location: [facility];  Service: Orthopedics;  Laterality: Right;  3-C   Patient Active Problem List   Diagnosis Date Noted   Status post total replacement of right hip 02/15/2022   Primary osteoarthritis of right hip 01/12/2022   Bilateral hand pain 11/21/2020   Positive ANA (antinuclear antibody) 11/21/2020   Nonspecific syndrome suggestive of viral illness 05/31/2020   Biceps tendinopathy of right upper extremity 12/04/2019   Complete tear of right rotator cuff 09/05/2019   Impingement syndrome of right shoulder 09/05/2019    PCP: Charolette Forward MD  REFERRING PROVIDER: Aundra Dubin, PA-C  REFERRING DIAG: [phone] (ICD-10-CM) - Status post total replacement of right hip  THERAPY DIAG:  Muscle weakness (generalized)  Pain in right hip  Difficulty in walking, not elsewhere classified  Rationale for Evaluation and Treatment:  Rehabilitation  ONSET DATE: 02/16/2023 THA   SUBJECTIVE:   SUBJECTIVE STATEMENT: Pt had Rt THA in 01/2022. She indicated she was doing well at home. She indicated she was getting better with ambulation and stairs at home.  She indicated that daugher had a seizure about a month ago that led [facility] to jump up quick to help and that led to pain in lateral hip and thigh.  She indicated she wanted to get that pain better.  She indicated she has trouble walking/standing, bending at this time due to the pain.   Using cane primarily in community and using walls/counter at home without cane.   PERTINENT HISTORY: HTN, history of Rt shoulder rotator cuff injury  PAIN:  NPRS scale: at current:  5/10, at worst 8/10, at best 0/10 Pain location: Rt hip/leg Pain description: catching pain Aggravating factors: prolonged standing/walking, stairs, bending down Relieving factors: ice, rest  PRECAUTIONS: Anterior hip  WEIGHT BEARING RESTRICTIONS: No  FALLS:  Has patient fallen in last 6 months? No  LIVING ENVIRONMENT:  Lives in: House/apartment Stairs: Flight of stairs to bedroom Has following equipment at home: [facility] with upper arm support posterioly  OCCUPATION: Runs [employer] with standing/walking all day - 7 x week sometimes  PLOF: Independent, work, household activity  PATIENT GOALS: Reduce pain, get strengthening to get  back to work.    OBJECTIVE:   PATIENT SURVEYS:  06/08/2022 FOTO intake: 51   predicted:  60  COGNITION: 06/08/2022 Overall cognitive status: WFL    SENSATION: 06/08/2022 No specific testing for sensation  EDEMA:  06/08/2022 No specific measurements for edema  MUSCLE LENGTH: 06/08/2022 No specific muscle length testing  POSTURE:  06/08/2022 Mild reduction of lumbar lordosis in standing, WB deviation to Lt leg mildly in stance  PALPATION: 06/08/2022 Tenderness to touch c trigger points, myofascial guarding throughout anterior Rt hip/thigh, lateral  hip/thigh  LOWER EXTREMITY ROM:   ROM Right 06/08/2022 Left 06/08/2022  Hip flexion    Hip extension    Hip abduction    Hip adduction    Hip internal rotation    Hip external rotation    Knee flexion    Knee extension    Ankle dorsiflexion    Ankle plantarflexion    Ankle inversion    Ankle eversion     (Blank rows = not tested)  LOWER EXTREMITY MMT:  MMT Right 06/08/2022 Left 06/08/2022  Hip flexion 3+/5 5/5  Hip extension    Hip abduction Resisted static testing sitting weak Resisted static testing sitting strong  Hip adduction    Hip internal rotation    Hip external rotation    Knee flexion 5/5 5/5  Knee extension 4/5 5/5  Ankle dorsiflexion    Ankle plantarflexion    Ankle inversion    Ankle eversion     (Blank rows = not tested)  LOWER EXTREMITY SPECIAL TESTS:  06/08/2022 No specific testing performed   FUNCTIONAL TESTS:  06/08/2022 18 inch chair transfer: on first try, increased difficulty c pain with weight shift to Lt side  .  Lt SLS:  10 seconds Rt SLS: < 3 seconds s loss of balance  TUG with cane: 14.8 seconds.  Without cane: 13.2 seconds  GAIT: 06/08/2022 SPC use in clinic household distances.  Able to perform independent as well.    TODAY'S TREATMENT                                                                          DATE: 06/08/2022 Therex:    HEP instruction/performance c cues for techniques, handout provided.  Trial set performed of each for comprehension and symptom assessment.  See below for exercise list  PATIENT EDUCATION:  06/08/2022 Education details: HEP, POC - education on gym options/silver sneakers importance Person educated: Patient Education method: Explanation, Demonstration, Verbal cues, and Handouts Education comprehension: verbalized understanding, returned demonstration, and verbal cues required  HOME EXERCISE PROGRAM: Access Code: [email or web address] URL: [email or web address] Date: 06/08/2022 Prepared by:  Scot Jun  Exercises - Seated Quad Set  - 3-5 x daily - 7 x weekly - 1 sets - 10 reps - 5 hold - Seated Hip Abduction with Resistance  - 1-2 x daily - 7 x weekly - 3 sets - 10 reps - Hooklying Clamshell with Resistance  - 1-2 x daily - 7 x weekly - 3 sets - 10 reps - Supine March  - 1-2 x daily - 7 x weekly - 1-2 sets - 10 reps  ASSESSMENT:  CLINICAL IMPRESSION: Patient is a 67 y.o. who comes to clinic with complaints of  Rt hip/leg pain s/p Rt THA 02/15/2022 with mobility, strength and movement coordination deficits that impair their ability to perform usual daily and recreational functional activities without increase difficulty/symptoms at this time.  Patient to benefit from skilled PT services to address impairments and limitations to improve to previous level of function without restriction secondary to condition.   OBJECTIVE IMPAIRMENTS: Abnormal gait, decreased activity tolerance, decreased balance, decreased coordination, decreased endurance, decreased mobility, difficulty walking, decreased strength, increased fascial restrictions, impaired perceived functional ability, increased muscle spasms, impaired flexibility, improper body mechanics, and pain.   ACTIVITY LIMITATIONS: carrying, lifting, bending, sitting, standing, squatting, stairs, transfers, bed mobility, and locomotion level  PARTICIPATION LIMITATIONS: meal prep, cleaning, laundry, interpersonal relationship, driving, shopping, community activity, occupation, and yard work  PERSONAL FACTORS:  HTN, history of Rt shoulder rotator cuff injury  are also affecting patient's functional outcome.   REHAB POTENTIAL: Good  CLINICAL DECISION MAKING: Stable/uncomplicated  EVALUATION COMPLEXITY: Low   GOALS: Goals reviewed with patient? Yes  SHORT TERM GOALS: (target date for Short term goals are 3 weeks 06/29/2022)   1.  Patient will demonstrate independent use of home exercise program to maintain progress from in clinic  treatments.  Goal status: New  LONG TERM GOALS: (target dates for all long term goals are 12 weeks  08/31/2022 )   1. Patient will demonstrate/report pain at worst less than or equal to 2/10 to facilitate minimal limitation in daily activity secondary to pain symptoms.  Goal status: New   2. Patient will demonstrate independent use of home exercise program to facilitate ability to maintain/progress functional gains from skilled physical therapy services.  Goal status: New   3. Patient will demonstrate FOTO outcome > or = 62 % to indicate reduced disability due to condition.  Goal status: New   4.  Patient will demonstrate Rt LE MMT 5/5 throughout to faciltiate usual transfers, stairs, squatting at [facility] for daily life.   Goal status: New   5.  Patient will demonstrate independent ambulation community distances s deviation > 500 ft for normal activity level.  Goal status: New   6.  Patient will demonstrate ascending/descending stairs reciprocal gait pattern s UE assist for household and community navigation.  Goal status: New   7.  Patient will demonstrate/report ability to return to work.  Goal Status: New   PLAN:  PT FREQUENCY: 2x/week  PT DURATION: 12 weeks  PLANNED INTERVENTIONS: Therapeutic exercises, Therapeutic activity, Neuro Muscular re-education, Balance training, Gait training, Patient/Family education, Joint mobilization, Stair training, DME instructions, Dry Needling, Electrical stimulation, Traction, Cryotherapy, vasopneumatic deviceMoist heat, Taping, Ultrasound, Ionotophoresis 4mg /ml Dexamethasone, and Manual therapy.  All included unless contraindicated  PLAN FOR NEXT SESSION: Review HEP knowledge/results.   Progressive Hip strengthening.  Possible manual for myofascial release.  Carney Bern, PT, DPT, OCS, ATC 06/08/22  11:06 AM   Referring diagnosis? K74.259 (ICD-10-CM) - Status post total replacement of right hip Treatment diagnosis? (if  different than referring diagnosis) M25.551 What was this (referring dx) caused by? [x]  Surgery []  Fall []  Ongoing issue []  Arthritis []  Other: ____________  Laterality: [x]  Rt []  Lt []  Both  Check all possible CPT codes:  *CHOOSE 10 OR LESS*    []  97110 (Therapeutic Exercise)  []  92507 (SLP Treatment)  []  97112 (Neuro Re-ed)   []  92526 (Swallowing Treatment)   []  97116 (Gait Training)   []  [id number] (Cognitive Training, 1st 15 minutes) []  97140 (Manual Therapy)   []  97130 (Cognitive Training, each add'l  15 minutes)  []  97164 (Re-evaluation)                              []  Other, List CPT Code ____________  []  97530 (Therapeutic Activities)     []  97535 (Self Care)   [x]  All codes above (97110 - 97535)  []  97012 (Mechanical Traction)  [x]  97014 (E-stim Unattended)  []  97032 (E-stim manual)  []  97033 (Ionto)  []  97035 (Ultrasound) [x]  97750 (Physical Performance Training) []  (Aquatic Therapy) []  97016 (Vasopneumatic Device) []  (Paraffin) []  97034 (Contrast Bath) []  97597 (Wound Care 1st 20 sq cm) []  97598 (Wound Care each add'l 20 sq cm) []  97760 (Orthotic Fabrication, Fitting, Training Initial) []  (Prosthetic Management and Training Initial) []  [phone] (Orthotic or Prosthetic Training/ Modification Subsequent)

## 2022-06-08 ENCOUNTER — Other Ambulatory Visit: Payer: Self-pay

## 2022-06-08 ENCOUNTER — Encounter: Payer: Self-pay | Admitting: Rehabilitative and Restorative Service Providers"

## 2022-06-08 ENCOUNTER — Ambulatory Visit (INDEPENDENT_AMBULATORY_CARE_PROVIDER_SITE_OTHER): Payer: Medicare HMO | Admitting: Rehabilitative and Restorative Service Providers"

## 2022-06-08 DIAGNOSIS — M6281 Muscle weakness (generalized): Secondary | ICD-10-CM

## 2022-06-08 DIAGNOSIS — R262 Difficulty in walking, not elsewhere classified: Secondary | ICD-10-CM | POA: Diagnosis not present

## 2022-06-08 DIAGNOSIS — M25551 Pain in right hip: Secondary | ICD-10-CM

## 2022-06-16 ENCOUNTER — Encounter: Payer: Self-pay | Admitting: Physical Therapy

## 2022-06-16 ENCOUNTER — Ambulatory Visit: Payer: Medicare HMO | Admitting: Physical Therapy

## 2022-06-16 DIAGNOSIS — R262 Difficulty in walking, not elsewhere classified: Secondary | ICD-10-CM | POA: Diagnosis not present

## 2022-06-16 DIAGNOSIS — M25551 Pain in right hip: Secondary | ICD-10-CM

## 2022-06-16 DIAGNOSIS — M6281 Muscle weakness (generalized): Secondary | ICD-10-CM | POA: Diagnosis not present

## 2022-06-16 NOTE — Therapy (Signed)
OUTPATIENT PHYSICAL THERAPY TREATMENT NOTE   Patient Name: Ellen Nelson MRN: [id number] DOB:15-Feb-1956, 67 y.o., female Today's Date: 06/16/2022  END OF SESSION:   PT End of Session - 06/16/22 1152     Visit Number 2    Number of Visits 24    Date for PT Re-Evaluation 08/31/22    Authorization Type [employer] $10 copay    Authorization - Visit Number 2    Authorization - Number of Visits 12    PT Start Time 1149    PT Stop Time 1230    PT Time Calculation (min) 41 min    Activity Tolerance Patient limited by pain    Behavior During Therapy WFL for tasks assessed/performed             Past Medical History:  Diagnosis Date   Allergy    Arthritis    Asthma    Hypertension    Mitral prolapse    Per patient   Rotator cuff tear    Right shoulder   Past Surgical History:  Procedure Laterality Date   ABDOMINAL HYSTERECTOMY     RETAINED PLACENTA REMOVAL     32 years ago per patient   TONSILLECTOMY     TOTAL HIP ARTHROPLASTY Right 02/15/2022   Procedure: RIGHT TOTAL HIP ARTHROPLASTY ANTERIOR APPROACH;  Surgeon: Leandrew Koyanagi, MD;  Location: [facility];  Service: Orthopedics;  Laterality: Right;  3-C   Patient Active Problem List   Diagnosis Date Noted   Status post total replacement of right hip 02/15/2022   Primary osteoarthritis of right hip 01/12/2022   Bilateral hand pain 11/21/2020   Positive ANA (antinuclear antibody) 11/21/2020   Nonspecific syndrome suggestive of viral illness 05/31/2020   Biceps tendinopathy of right upper extremity 12/04/2019   Complete tear of right rotator cuff 09/05/2019   Impingement syndrome of right shoulder 09/05/2019     THERAPY DIAG:  Pain in right hip  Muscle weakness (generalized)  Difficulty in walking, not elsewhere classified   PCP: Charolette Forward MD   REFERRING PROVIDER: Aundra Dubin, PA-C   REFERRING DIAG: [phone] (ICD-10-CM) - Status post total replacement of right hip   EVAL THERAPY DIAG:  Muscle weakness  (generalized)   Pain in right hip   Difficulty in walking, not elsewhere classified   Rationale for Evaluation and Treatment: Rehabilitation   ONSET DATE: 02/16/2023 THA    SUBJECTIVE:    SUBJECTIVE STATEMENT: Doing okay, a little sore.     PERTINENT HISTORY: HTN, history of Rt shoulder rotator cuff injury   PAIN:  NPRS scale: at current:  5 currently/10, at worst 8/10, at best 0/10 Pain location: Rt hip/leg Pain description: catching pain Aggravating factors: prolonged standing/walking, stairs, bending down Relieving factors: ice, rest   PRECAUTIONS: Anterior hip   WEIGHT BEARING RESTRICTIONS: No   FALLS:  Has patient fallen in last 6 months? No   LIVING ENVIRONMENT:   Lives in: House/apartment Stairs: Flight of stairs to bedroom Has following equipment at home: [facility] with upper arm support posterioly   OCCUPATION: Runs [employer] with standing/walking all day - 7 x week sometimes   PLOF: Independent, work, household activity   PATIENT GOALS: Reduce pain, get strengthening to get back to work.      OBJECTIVE:    PATIENT SURVEYS:  06/08/2022 FOTO intake: 51   predicted:  62   POSTURE:  06/08/2022 Mild reduction of lumbar lordosis in standing, WB deviation to Lt leg mildly in stance  PALPATION: 06/08/2022 Tenderness to touch c trigger points, myofascial guarding throughout anterior Rt hip/thigh, lateral hip/thigh     LOWER EXTREMITY MMT:   MMT Right 06/08/2022 Left 06/08/2022  Hip flexion 3+/5 5/5  Hip extension      Hip abduction Resisted static testing sitting weak Resisted static testing sitting strong  Knee flexion 5/5 5/5  Knee extension 4/5 5/5   (Blank rows = not tested)   FUNCTIONAL TESTS:  06/08/2022 18 inch chair transfer: on first try, increased difficulty c pain with weight shift to Lt side  .  Lt SLS:  10 seconds Rt SLS: < 3 seconds s loss of balance   TUG with cane: 14.8 seconds.  Without cane: 13.2 seconds    GAIT: 06/08/2022 SPC use in clinic household distances.  Able to perform independent as well.      TODAY'S TREATMENT                                                                           DATE:  06/16/22 TherEx NuStep L5 x 8 min Rt seated quad set 10 x 5 sec hold Seated hip abduction L3 band 2x10 Hooklying single limb abduction L3 band 2x10 bil Hooklying march with L3 band 2x10 bil Bridges 2x10; 5 sec hold Rt LAQ 4# 2x10; 3 sec hol Sit to/from stand x 10 reps; no UE support, elevated height   06/08/2022 Therex:    HEP instruction/performance c cues for techniques, handout provided.  Trial set performed of each for comprehension and symptom assessment.  See below for exercise list   PATIENT EDUCATION:  06/08/2022 Education details: HEP, POC - education on gym options/silver sneakers importance Person educated: Patient Education method: Explanation, Demonstration, Verbal cues, and Handouts Education comprehension: verbalized understanding, returned demonstration, and verbal cues required   HOME EXERCISE PROGRAM: Access Code: [email or web address] URL: [email or web address] Date: 06/08/2022 Prepared by: Scot Jun   Exercises - Seated Quad Set  - 3-5 x daily - 7 x weekly - 1 sets - 10 reps - 5 hold - Seated Hip Abduction with Resistance  - 1-2 x daily - 7 x weekly - 3 sets - 10 reps - Hooklying Clamshell with Resistance  - 1-2 x daily - 7 x weekly - 3 sets - 10 reps - Supine March  - 1-2 x daily - 7 x weekly - 1-2 sets - 10 reps   ASSESSMENT:   CLINICAL IMPRESSION: Pt c/o soreness today from exercises.  Demonstrating good understanding of initial HEP.  Will continue to benefit from PT to maximize function.    OBJECTIVE IMPAIRMENTS: Abnormal gait, decreased activity tolerance, decreased balance, decreased coordination, decreased endurance, decreased mobility, difficulty walking, decreased strength, increased fascial restrictions, impaired perceived functional  ability, increased muscle spasms, impaired flexibility, improper body mechanics, and pain.    ACTIVITY LIMITATIONS: carrying, lifting, bending, sitting, standing, squatting, stairs, transfers, bed mobility, and locomotion level   PARTICIPATION LIMITATIONS: meal prep, cleaning, laundry, interpersonal relationship, driving, shopping, community activity, occupation, and yard work   PERSONAL FACTORS:  HTN, history of Rt shoulder rotator cuff injury  are also affecting patient's functional outcome.    REHAB POTENTIAL: Good   CLINICAL DECISION MAKING: Stable/uncomplicated   EVALUATION COMPLEXITY: Low  GOALS: Goals reviewed with patient? Yes   SHORT TERM GOALS: (target date for Short term goals are 3 weeks 06/29/2022)    1.  Patient will demonstrate independent use of home exercise program to maintain progress from in clinic treatments.  Goal status: New   LONG TERM GOALS: (target dates for all long term goals are 12 weeks  08/31/2022 )   1. Patient will demonstrate/report pain at worst less than or equal to 2/10 to facilitate minimal limitation in daily activity secondary to pain symptoms.  Goal status: New   2. Patient will demonstrate independent use of home exercise program to facilitate ability to maintain/progress functional gains from skilled physical therapy services.  Goal status: New   3. Patient will demonstrate FOTO outcome > or = 62 % to indicate reduced disability due to condition.  Goal status: New   4.  Patient will demonstrate Rt LE MMT 5/5 throughout to faciltiate usual transfers, stairs, squatting at [facility] for daily life.   Goal status: New   5.  Patient will demonstrate independent ambulation community distances s deviation > 500 ft for normal activity level.  Goal status: New   6.  Patient will demonstrate ascending/descending stairs reciprocal gait pattern s UE assist for household and community navigation.  Goal status: New   7.  Patient will  demonstrate/report ability to return to work.  Goal Status: New     PLAN:   PT FREQUENCY: 2x/week   PT DURATION: 12 weeks   PLANNED INTERVENTIONS: Therapeutic exercises, Therapeutic activity, Neuro Muscular re-education, Balance training, Gait training, Patient/Family education, Joint mobilization, Stair training, DME instructions, Dry Needling, Electrical stimulation, Traction, Cryotherapy, vasopneumatic deviceMoist heat, Taping, Ultrasound, Ionotophoresis 4mg /ml Dexamethasone, and Manual therapy.  All included unless contraindicated   PLAN FOR NEXT SESSION: progress HEP PRN, standing hip exercises, balance,  Progressive Hip strengthening.  Possible manual for myofascial release.  , PT, DPT 06/16/22 12:38 PM

## 2022-06-18 ENCOUNTER — Ambulatory Visit: Payer: Medicare HMO | Admitting: Rehabilitative and Restorative Service Providers"

## 2022-06-18 ENCOUNTER — Encounter: Payer: Self-pay | Admitting: Rehabilitative and Restorative Service Providers"

## 2022-06-18 DIAGNOSIS — R262 Difficulty in walking, not elsewhere classified: Secondary | ICD-10-CM

## 2022-06-18 DIAGNOSIS — M25551 Pain in right hip: Secondary | ICD-10-CM | POA: Diagnosis not present

## 2022-06-18 DIAGNOSIS — M6281 Muscle weakness (generalized): Secondary | ICD-10-CM | POA: Diagnosis not present

## 2022-06-18 NOTE — Therapy (Signed)
OUTPATIENT PHYSICAL THERAPY TREATMENT NOTE   Patient Name: Ellen Nelson MRN: [id number] DOB:1955-07-12, 67 y.o., female Today's Date: 06/18/2022  END OF SESSION:   PT End of Session - 06/18/22 1017     Visit Number 3    Number of Visits 24    Date for PT Re-Evaluation 08/31/22    Authorization Type [employer] $10 copay    Authorization - Visit Number 3    Authorization - Number of Visits 12    PT Start Time 1013    PT Stop Time 1052    PT Time Calculation (min) 39 min    Activity Tolerance Patient tolerated treatment well    Behavior During Therapy WFL for tasks assessed/performed              Past Medical History:  Diagnosis Date   Allergy    Arthritis    Asthma    Hypertension    Mitral prolapse    Per patient   Rotator cuff tear    Right shoulder   Past Surgical History:  Procedure Laterality Date   ABDOMINAL HYSTERECTOMY     RETAINED PLACENTA REMOVAL     32 years ago per patient   TONSILLECTOMY     TOTAL HIP ARTHROPLASTY Right 02/15/2022   Procedure: RIGHT TOTAL HIP ARTHROPLASTY ANTERIOR APPROACH;  Surgeon: Tarry Kos, MD;  Location: [facility];  Service: Orthopedics;  Laterality: Right;  3-C   Patient Active Problem List   Diagnosis Date Noted   Status post total replacement of right hip 02/15/2022   Primary osteoarthritis of right hip 01/12/2022   Bilateral hand pain 11/21/2020   Positive ANA (antinuclear antibody) 11/21/2020   Nonspecific syndrome suggestive of viral illness 05/31/2020   Biceps tendinopathy of right upper extremity 12/04/2019   Complete tear of right rotator cuff 09/05/2019   Impingement syndrome of right shoulder 09/05/2019     THERAPY DIAG:  Pain in right hip  Muscle weakness (generalized)  Difficulty in walking, not elsewhere classified   PCP: Rinaldo Cloud MD   REFERRING PROVIDER: Cristie Hem, PA-C   REFERRING DIAG: [phone] (ICD-10-CM) - Status post total replacement of right hip   EVAL THERAPY DIAG:  Muscle  weakness (generalized)   Pain in right hip   Difficulty in walking, not elsewhere classified   Rationale for Evaluation and Treatment: Rehabilitation   ONSET DATE: 02/16/2023 THA    SUBJECTIVE:    SUBJECTIVE STATEMENT: She mentioned having 5/10 groin pian about the same as start.  No complaints reported c HEP.    PERTINENT HISTORY: HTN, history of Rt shoulder rotator cuff injury   PAIN:  NPRS scale: at current:  5 currently/10, at worst 8/10, at best 0/10 Pain location: Rt hip/leg Pain description: catching pain Aggravating factors: prolonged standing/walking, stairs, bending down Relieving factors: ice, rest   PRECAUTIONS: Anterior hip   WEIGHT BEARING RESTRICTIONS: No   FALLS:  Has patient fallen in last 6 months? No   LIVING ENVIRONMENT:   Lives in: House/apartment Stairs: Flight of stairs to bedroom Has following equipment at home: [facility] with upper arm support posterioly   OCCUPATION: Runs [employer] with standing/walking all day - 7 x week sometimes   PLOF: Independent, work, household activity   PATIENT GOALS: Reduce pain, get strengthening to get back to work.      OBJECTIVE:    PATIENT SURVEYS:  06/08/2022 FOTO intake: 51   predicted:  62   POSTURE:  06/08/2022 Mild reduction of lumbar lordosis  in standing, WB deviation to Lt leg mildly in stance   PALPATION: 06/08/2022 Tenderness to touch c trigger points, myofascial guarding throughout anterior Rt hip/thigh, lateral hip/thigh     LOWER EXTREMITY MMT:   MMT Right 06/08/2022 Left 06/08/2022 Right 06/18/2022  Hip flexion 3+/5 5/5 4/5 c pain  Hip extension       Hip abduction Resisted static testing sitting weak Resisted static testing sitting strong   Knee flexion 5/5 5/5   Knee extension 4/5 5/5    (Blank rows = not tested)   FUNCTIONAL TESTS:  06/08/2022 18 inch chair transfer: on first try, increased difficulty c pain with weight shift to Lt side  .  Lt SLS:  10 seconds Rt SLS: < 3  seconds s loss of balance   TUG with cane: 14.8 seconds.  Without cane: 13.2 seconds   GAIT: 06/08/2022 SPC use in clinic household distances.  Able to perform independent as well.      TODAY'S TREATMENT                                                                DATE: 06/18/2022 TherEx NuStep L6 x 10 min Supine bridge 2-3 sec hold c green band around knees for hold out 2 x 10  Supine hooklying clam shell with isometric hold opposite leg green band 20x each Hooklying march x 15 bilateral (pain in groin reported throughout) Rt LAQ 5# 2x15; 3 sec hol Sit to/from stand x 10 reps; no UE support, elevated height  Neuro Re-ed Pre gait step over 4 inch step c fwd/retro weight shift on stance leg x 15 bilateral  Tandem stance on airex foam 1 min x 1 bilateral c occasional HHA Tandem ambulation fwd/back on foam in // bars occasional HHA 6 ft beam x 5 each way  TODAY'S TREATMENT                                                                DATE: 06/16/2022 TherEx NuStep L5 x 8 min Rt seated quad set 10 x 5 sec hold Seated hip abduction L3 band 2x10 Hooklying single limb abduction L3 band 2x10 bil Hooklying march with L3 band 2x10 bil Bridges 2x10; 5 sec hold Rt LAQ 4# 2x10; 3 sec hol Sit to/from stand x 10 reps; no UE support, elevated height   TODAY'S TREATMENT                                                                DATE: 06/08/2022 Therex:    HEP instruction/performance c cues for techniques, handout provided.  Trial set performed of each for comprehension and symptom assessment.  See below for exercise list   PATIENT EDUCATION:  06/08/2022 Education details: HEP, POC - education on gym options/silver sneakers importance Person educated: Patient Education method: Explanation, Demonstration, Verbal  cues, and Handouts Education comprehension: verbalized understanding, returned demonstration, and verbal cues required   HOME EXERCISE PROGRAM: Access Code: [email or web address] URL:  [email or web address] Date: 06/08/2022 Prepared by: Scot Jun   Exercises - Seated Quad Set  - 3-5 x daily - 7 x weekly - 1 sets - 10 reps - 5 hold - Seated Hip Abduction with Resistance  - 1-2 x daily - 7 x weekly - 3 sets - 10 reps - Hooklying Clamshell with Resistance  - 1-2 x daily - 7 x weekly - 3 sets - 10 reps - Supine March  - 1-2 x daily - 7 x weekly - 1-2 sets - 10 reps   ASSESSMENT:   CLINICAL IMPRESSION: Mild improvements in strength testing noted for Rt hip flexion.  Continued difficulty c loaded lifting of Rt leg with symptoms.  As performance improves, functional movement of Rt leg to improve as well.  Continued skilled PT services indicated at this time.    OBJECTIVE IMPAIRMENTS: Abnormal gait, decreased activity tolerance, decreased balance, decreased coordination, decreased endurance, decreased mobility, difficulty walking, decreased strength, increased fascial restrictions, impaired perceived functional ability, increased muscle spasms, impaired flexibility, improper body mechanics, and pain.    ACTIVITY LIMITATIONS: carrying, lifting, bending, sitting, standing, squatting, stairs, transfers, bed mobility, and locomotion level   PARTICIPATION LIMITATIONS: meal prep, cleaning, laundry, interpersonal relationship, driving, shopping, community activity, occupation, and yard work   PERSONAL FACTORS:  HTN, history of Rt shoulder rotator cuff injury  are also affecting patient's functional outcome.    REHAB POTENTIAL: Good   CLINICAL DECISION MAKING: Stable/uncomplicated   EVALUATION COMPLEXITY: Low     GOALS: Goals reviewed with patient? Yes   SHORT TERM GOALS: (target date for Short term goals are 3 weeks 06/29/2022)    1.  Patient will demonstrate independent use of home exercise program to maintain progress from in clinic treatments.  Goal status: on going 06/18/2022   LONG TERM GOALS: (target dates for all long term goals are 12 weeks  08/31/2022  )   1. Patient will demonstrate/report pain at worst less than or equal to 2/10 to facilitate minimal limitation in daily activity secondary to pain symptoms.  Goal status: New   2. Patient will demonstrate independent use of home exercise program to facilitate ability to maintain/progress functional gains from skilled physical therapy services.  Goal status: New   3. Patient will demonstrate FOTO outcome > or = 62 % to indicate reduced disability due to condition.  Goal status: New   4.  Patient will demonstrate Rt LE MMT 5/5 throughout to faciltiate usual transfers, stairs, squatting at [facility] for daily life.   Goal status: New   5.  Patient will demonstrate independent ambulation community distances s deviation > 500 ft for normal activity level.  Goal status: New   6.  Patient will demonstrate ascending/descending stairs reciprocal gait pattern s UE assist for household and community navigation.  Goal status: New   7.  Patient will demonstrate/report ability to return to work.  Goal Status: New     PLAN:   PT FREQUENCY: 2x/week   PT DURATION: 12 weeks   PLANNED INTERVENTIONS: Therapeutic exercises, Therapeutic activity, Neuro Muscular re-education, Balance training, Gait training, Patient/Family education, Joint mobilization, Stair training, DME instructions, Dry Needling, Electrical stimulation, Traction, Cryotherapy, vasopneumatic deviceMoist heat, Taping, Ultrasound, Ionotophoresis 4mg /ml Dexamethasone, and Manual therapy.  All included unless contraindicated   PLAN FOR NEXT SESSION: Progressive strengthening to tolerance for quad and hip flexor Rt. Compliant surface  balance.  Intro leg press if tolerated.    Scot Jun, PT, DPT, OCS, ATC 06/18/22  10:54 AM

## 2022-06-22 ENCOUNTER — Encounter: Payer: Self-pay | Admitting: Rehabilitative and Restorative Service Providers"

## 2022-06-22 ENCOUNTER — Ambulatory Visit: Payer: Medicare HMO | Admitting: Rehabilitative and Restorative Service Providers"

## 2022-06-22 DIAGNOSIS — M6281 Muscle weakness (generalized): Secondary | ICD-10-CM | POA: Diagnosis not present

## 2022-06-22 DIAGNOSIS — R262 Difficulty in walking, not elsewhere classified: Secondary | ICD-10-CM

## 2022-06-22 DIAGNOSIS — M25551 Pain in right hip: Secondary | ICD-10-CM | POA: Diagnosis not present

## 2022-06-22 NOTE — Therapy (Signed)
OUTPATIENT PHYSICAL THERAPY TREATMENT NOTE   Patient Name: Ellen Nelson MRN: [id number] DOB:1956-01-14, 67 y.o., female Today's Date: 06/22/2022  END OF SESSION:   PT End of Session - 06/22/22 1012     Visit Number 4    Number of Visits 24    Date for PT Re-Evaluation 08/31/22    Authorization Type [employer] $10 copay    Authorization - Visit Number 4    Authorization - Number of Visits 12    PT Start Time 1013    PT Stop Time 1053    PT Time Calculation (min) 40 min    Activity Tolerance Patient tolerated treatment well    Behavior During Therapy WFL for tasks assessed/performed               Past Medical History:  Diagnosis Date   Allergy    Arthritis    Asthma    Hypertension    Mitral prolapse    Per patient   Rotator cuff tear    Right shoulder   Past Surgical History:  Procedure Laterality Date   ABDOMINAL HYSTERECTOMY     RETAINED PLACENTA REMOVAL     32 years ago per patient   TONSILLECTOMY     TOTAL HIP ARTHROPLASTY Right 02/15/2022   Procedure: RIGHT TOTAL HIP ARTHROPLASTY ANTERIOR APPROACH;  Surgeon: Tarry Kos, MD;  Location: [facility];  Service: Orthopedics;  Laterality: Right;  3-C   Patient Active Problem List   Diagnosis Date Noted   Status post total replacement of right hip 02/15/2022   Primary osteoarthritis of right hip 01/12/2022   Bilateral hand pain 11/21/2020   Positive ANA (antinuclear antibody) 11/21/2020   Nonspecific syndrome suggestive of viral illness 05/31/2020   Biceps tendinopathy of right upper extremity 12/04/2019   Complete tear of right rotator cuff 09/05/2019   Impingement syndrome of right shoulder 09/05/2019     THERAPY DIAG:  Pain in right hip  Muscle weakness (generalized)  Difficulty in walking, not elsewhere classified   PCP: Rinaldo Cloud MD   REFERRING PROVIDER: Cristie Hem, PA-C   REFERRING DIAG: [phone] (ICD-10-CM) - Status post total replacement of right hip   EVAL THERAPY DIAG:  Muscle  weakness (generalized)   Pain in right hip   Difficulty in walking, not elsewhere classified   Rationale for Evaluation and Treatment: Rehabilitation   ONSET DATE: 02/16/2023 THA    SUBJECTIVE:    SUBJECTIVE STATEMENT: She indicated having less severity of groin pain since last visit.  Reported 3/10 today.    PERTINENT HISTORY: HTN, history of Rt shoulder rotator cuff injury   PAIN:  NPRS scale: 3/10 Pain location: Rt hip/leg Pain description: catching pain Aggravating factors: prolonged standing/walking, stairs, bending down Relieving factors: ice, rest   PRECAUTIONS: Anterior hip   WEIGHT BEARING RESTRICTIONS: No   FALLS:  Has patient fallen in last 6 months? No   LIVING ENVIRONMENT:   Lives in: House/apartment Stairs: Flight of stairs to bedroom Has following equipment at home: [facility] with upper arm support posterioly   OCCUPATION: Runs [employer] with standing/walking all day - 7 x week sometimes   PLOF: Independent, work, household activity   PATIENT GOALS: Reduce pain, get strengthening to get back to work.      OBJECTIVE:    PATIENT SURVEYS:  06/08/2022 FOTO intake: 51   predicted:  62   POSTURE:  06/08/2022 Mild reduction of lumbar lordosis in standing, WB deviation to Lt leg mildly in stance  PALPATION: 06/08/2022 Tenderness to touch c trigger points, myofascial guarding throughout anterior Rt hip/thigh, lateral hip/thigh     LOWER EXTREMITY MMT:   MMT Right 06/08/2022 Left 06/08/2022 Right 06/18/2022  Hip flexion 3+/5 5/5 4/5 c pain  Hip extension       Hip abduction Resisted static testing sitting weak Resisted static testing sitting strong   Knee flexion 5/5 5/5   Knee extension 4/5 5/5    (Blank rows = not tested)   FUNCTIONAL TESTS:  06/08/2022 18 inch chair transfer: on first try, increased difficulty c pain with weight shift to Lt side  .  Lt SLS:  10 seconds Rt SLS: < 3 seconds s loss of balance   TUG with cane: 14.8 seconds.   Without cane: 13.2 seconds   GAIT: 06/08/2022 SPC use in clinic household distances.  Able to perform independent as well.      TODAY'S TREATMENT                                                                DATE: 06/22/2022 TherEx NuStep L6 x 10 min Leg press double leg 75 lbs x 15, single leg x 15 37 lbs , performed bilaterally   Neuro Re-ed Pre gait step over 4 inch step c fwd/retro weight shift on stance leg x 15 bilateral  Fitter board rocker fwd/back 30x c light touch focus Lateral hurdle stepping 6 inch hurdles 10 ft x 4 each way in // bars Fwd hurdle stepping 6 inch 10 ft in // bars x 4 each leg leading, performed bilaterally  TODAY'S TREATMENT                                                                DATE: 06/18/2022 TherEx NuStep L6 x 10 min Supine bridge 2-3 sec hold c green band around knees for hold out 2 x 10  Supine hooklying clam shell with isometric hold opposite leg green band 20x each Hooklying march x 15 bilateral (pain in groin reported throughout) Rt LAQ 5# 2x15; 3 sec hol Sit to/from stand x 10 reps; no UE support, elevated height  Neuro Re-ed Pre gait step over 4 inch step c fwd/retro weight shift on stance leg x 15 bilateral  Tandem stance on airex foam 1 min x 1 bilateral c occasional HHA Tandem ambulation fwd/back on foam in // bars occasional HHA 6 ft beam x 5 each way  TODAY'S TREATMENT                                                                DATE: 06/16/2022 TherEx NuStep L5 x 8 min Rt seated quad set 10 x 5 sec hold Seated hip abduction L3 band 2x10 Hooklying single limb abduction L3 band 2x10 bil Hooklying march with L3 band 2x10 bil Bridges 2x10; 5 sec  hold Rt LAQ 4# 2x10; 3 sec hol Sit to/from stand x 10 reps; no UE support, elevated height    PATIENT EDUCATION:  06/08/2022 Education details: HEP, POC - education on gym options/silver sneakers importance Person educated: Patient Education method: Explanation, Demonstration,  Verbal cues, and Handouts Education comprehension: verbalized understanding, returned demonstration, and verbal cues required   HOME EXERCISE PROGRAM: Access Code: [place] URL: [email or web address] Date: 06/08/2022 Prepared by: Scot Jun   Exercises - Seated Quad Set  - 3-5 x daily - 7 x weekly - 1 sets - 10 reps - 5 hold - Seated Hip Abduction with Resistance  - 1-2 x daily - 7 x weekly - 3 sets - 10 reps - Hooklying Clamshell with Resistance  - 1-2 x daily - 7 x weekly - 3 sets - 10 reps - Supine March  - 1-2 x daily - 7 x weekly - 1-2 sets - 10 reps   ASSESSMENT:   CLINICAL IMPRESSION: Improved gait speed quality and stance on Rt leg noted with movement today.   She continued to show improvements in ability to perform interventions with reduction in symptoms.    OBJECTIVE IMPAIRMENTS: Abnormal gait, decreased activity tolerance, decreased balance, decreased coordination, decreased endurance, decreased mobility, difficulty walking, decreased strength, increased fascial restrictions, impaired perceived functional ability, increased muscle spasms, impaired flexibility, improper body mechanics, and pain.    ACTIVITY LIMITATIONS: carrying, lifting, bending, sitting, standing, squatting, stairs, transfers, bed mobility, and locomotion level   PARTICIPATION LIMITATIONS: meal prep, cleaning, laundry, interpersonal relationship, driving, shopping, community activity, occupation, and yard work   PERSONAL FACTORS:  HTN, history of Rt shoulder rotator cuff injury  are also affecting patient's functional outcome.    REHAB POTENTIAL: Good   CLINICAL DECISION MAKING: Stable/uncomplicated   EVALUATION COMPLEXITY: Low     GOALS: Goals reviewed with patient? Yes   SHORT TERM GOALS: (target date for Short term goals are 3 weeks 06/29/2022)    1.  Patient will demonstrate independent use of home exercise program to maintain progress from in clinic treatments.  Goal status:  on going 06/18/2022   LONG TERM GOALS: (target dates for all long term goals are 12 weeks  08/31/2022 )   1. Patient will demonstrate/report pain at worst less than or equal to 2/10 to facilitate minimal limitation in daily activity secondary to pain symptoms.  Goal status: New   2. Patient will demonstrate independent use of home exercise program to facilitate ability to maintain/progress functional gains from skilled physical therapy services.  Goal status: New   3. Patient will demonstrate FOTO outcome > or = 62 % to indicate reduced disability due to condition.  Goal status: New   4.  Patient will demonstrate Rt LE MMT 5/5 throughout to faciltiate usual transfers, stairs, squatting at [facility] for daily life.   Goal status: New   5.  Patient will demonstrate independent ambulation community distances s deviation > 500 ft for normal activity level.  Goal status: New   6.  Patient will demonstrate ascending/descending stairs reciprocal gait pattern s UE assist for household and community navigation.  Goal status: New   7.  Patient will demonstrate/report ability to return to work.  Goal Status: New     PLAN:   PT FREQUENCY: 2x/week   PT DURATION: 12 weeks   PLANNED INTERVENTIONS: Therapeutic exercises, Therapeutic activity, Neuro Muscular re-education, Balance training, Gait training, Patient/Family education, Joint mobilization, Stair training, DME instructions, Dry Needling, Electrical stimulation, Traction, Cryotherapy, vasopneumatic deviceMoist heat,  Taping, Ultrasound, Ionotophoresis 4mg /ml Dexamethasone, and Manual therapy.  All included unless contraindicated   PLAN FOR NEXT SESSION:Continue strengthening and balance improvements.    Scot Jun, PT, DPT, OCS, ATC 06/22/22  10:55 AM

## 2022-06-24 ENCOUNTER — Encounter: Payer: Medicare HMO | Admitting: Rehabilitative and Restorative Service Providers"

## 2022-06-29 ENCOUNTER — Ambulatory Visit: Payer: Medicare HMO | Admitting: Rehabilitative and Restorative Service Providers"

## 2022-06-29 ENCOUNTER — Encounter: Payer: Self-pay | Admitting: Rehabilitative and Restorative Service Providers"

## 2022-06-29 DIAGNOSIS — R262 Difficulty in walking, not elsewhere classified: Secondary | ICD-10-CM | POA: Diagnosis not present

## 2022-06-29 DIAGNOSIS — M25551 Pain in right hip: Secondary | ICD-10-CM

## 2022-06-29 DIAGNOSIS — M6281 Muscle weakness (generalized): Secondary | ICD-10-CM

## 2022-06-29 NOTE — Therapy (Signed)
OUTPATIENT PHYSICAL THERAPY TREATMENT NOTE   Patient Name: Ellen Nelson MRN: [id number] DOB:1955/10/30, 67 y.o., female Today's Date: 06/29/2022  END OF SESSION:   PT End of Session - 06/29/22 1110     Visit Number 5    Number of Visits 24    Date for PT Re-Evaluation 08/31/22    Authorization Type [employer] $10 copay    Authorization - Visit Number 5    Authorization - Number of Visits 12    PT Start Time 1107    PT Stop Time 1145    PT Time Calculation (min) 38 min    Activity Tolerance Patient tolerated treatment well    Behavior During Therapy WFL for tasks assessed/performed                Past Medical History:  Diagnosis Date   Allergy    Arthritis    Asthma    Hypertension    Mitral prolapse    Per patient   Rotator cuff tear    Right shoulder   Past Surgical History:  Procedure Laterality Date   ABDOMINAL HYSTERECTOMY     RETAINED PLACENTA REMOVAL     32 years ago per patient   TONSILLECTOMY     TOTAL HIP ARTHROPLASTY Right 02/15/2022   Procedure: RIGHT TOTAL HIP ARTHROPLASTY ANTERIOR APPROACH;  Surgeon: Leandrew Koyanagi, MD;  Location: [place];  Service: Orthopedics;  Laterality: Right;  3-C   Patient Active Problem List   Diagnosis Date Noted   Status post total replacement of right hip 02/15/2022   Primary osteoarthritis of right hip 01/12/2022   Bilateral hand pain 11/21/2020   Positive ANA (antinuclear antibody) 11/21/2020   Nonspecific syndrome suggestive of viral illness 05/31/2020   Biceps tendinopathy of right upper extremity 12/04/2019   Complete tear of right rotator cuff 09/05/2019   Impingement syndrome of right shoulder 09/05/2019     THERAPY DIAG:  Pain in right hip  Muscle weakness (generalized)  Difficulty in walking, not elsewhere classified   PCP: Charolette Forward MD   REFERRING PROVIDER: Aundra Dubin, PA-C   REFERRING DIAG: [phone] (ICD-10-CM) - Status post total replacement of right hip   EVAL THERAPY DIAG:  Muscle  weakness (generalized)   Pain in right hip   Difficulty in walking, not elsewhere classified   Rationale for Evaluation and Treatment: Rehabilitation   ONSET DATE: 02/16/2023 THA    SUBJECTIVE:    SUBJECTIVE STATEMENT: She indicated no pain complaints upon arrival today.  She reported standing prolonged, squatting can still be troublesome.    PERTINENT HISTORY: HTN, history of Rt shoulder rotator cuff injury   PAIN:  NPRS scale: 0/10 Pain location: Rt hip/leg Pain description: catching pain Aggravating factors: prolonged standing/walking, stairs, bending down Relieving factors: ice, rest   PRECAUTIONS: Anterior hip   WEIGHT BEARING RESTRICTIONS: No   FALLS:  Has patient fallen in last 6 months? No   LIVING ENVIRONMENT:   Lives in: House/apartment Stairs: Flight of stairs to bedroom Has following equipment at home: [facility] with upper arm support posterioly   OCCUPATION: Runs [employer] with standing/walking all day - 7 x week sometimes   PLOF: Independent, work, household activity   PATIENT GOALS: Reduce pain, get strengthening to get back to work.      OBJECTIVE:    PATIENT SURVEYS:  06/08/2022 FOTO intake: 51   predicted:  62   POSTURE:  06/08/2022 Mild reduction of lumbar lordosis in standing, WB deviation to Lt leg  mildly in stance   PALPATION: 06/08/2022 Tenderness to touch c trigger points, myofascial guarding throughout anterior Rt hip/thigh, lateral hip/thigh     LOWER EXTREMITY MMT:   MMT Right 06/08/2022 Left 06/08/2022 Right 06/18/2022 Right 06/29/2022  Hip flexion 3+/5 5/5 4/5 c pain 5/5 c "sore"  Hip extension        Hip abduction Resisted static testing sitting weak Resisted static testing sitting strong    Knee flexion 5/5 5/5    Knee extension 4/5 5/5     (Blank rows = not tested)   FUNCTIONAL TESTS:  06/08/2022 18 inch chair transfer: on first try, increased difficulty c pain with weight shift to Lt side  .  Lt SLS:  10 seconds Rt  SLS: < 3 seconds s loss of balance   TUG with cane: 14.8 seconds.  Without cane: 13.2 seconds   GAIT: 06/08/2022 SPC use in clinic household distances.  Able to perform independent as well.      TODAY'S TREATMENT                                                                DATE: 06/29/2022 TherEx NuStep L6 x 10 min Leg press double leg 75 lbs x 15, single leg 2 x 15 37 lbs , performed bilaterally Step down 6 inch x 15 WB on Rt leg (cues for home use) Sit to stand to sit 18 inch chair x 10 slow lowering focus  Seated Rt quad set 5 sec hold x 10  Seated Rt quad set c SLR 2 x 5   Neuro Re-ed SLS 9 inch hurdle clear forward x 15 bilateral occasional hand assist.  Lateral hurdle stepping 9 inch one hurdle x 15 each way   TODAY'S TREATMENT                                                                DATE: 06/22/2022 TherEx NuStep L6 x 10 min Leg press double leg 75 lbs x 15, single leg x 15 37 lbs , performed bilaterally   Neuro Re-ed Pre gait step over 4 inch step c fwd/retro weight shift on stance leg x 15 bilateral  Fitter board rocker fwd/back 30x c light touch focus Lateral hurdle stepping 6 inch hurdles 10 ft x 4 each way in // bars Fwd hurdle stepping 6 inch 10 ft in // bars x 4 each leg leading, performed bilaterally  TODAY'S TREATMENT                                                                DATE: 06/18/2022 TherEx NuStep L6 x 10 min Supine bridge 2-3 sec hold c green band around knees for hold out 2 x 10  Supine hooklying clam shell with isometric hold opposite leg green band 20x each Hooklying march x 15 bilateral (  pain in groin reported throughout) Rt LAQ 5# 2x15; 3 sec hol Sit to/from stand x 10 reps; no UE support, elevated height  Neuro Re-ed Pre gait step over 4 inch step c fwd/retro weight shift on stance leg x 15 bilateral  Tandem stance on airex foam 1 min x 1 bilateral c occasional HHA Tandem ambulation fwd/back on foam in // bars occasional HHA 6 ft  beam x 5 each way    PATIENT EDUCATION:  06/08/2022 Education details: HEP, POC - education on gym options/silver sneakers importance Person educated: Patient Education method: Explanation, Demonstration, Verbal cues, and Handouts Education comprehension: verbalized understanding, returned demonstration, and verbal cues required   HOME EXERCISE PROGRAM: Access Code: [place] URL: [email or web address] Date: 06/08/2022 Prepared by: Scot Jun   Exercises - Seated Quad Set  - 3-5 x daily - 7 x weekly - 1 sets - 10 reps - 5 hold - Seated Hip Abduction with Resistance  - 1-2 x daily - 7 x weekly - 3 sets - 10 reps - Hooklying Clamshell with Resistance  - 1-2 x daily - 7 x weekly - 3 sets - 10 reps - Supine March  - 1-2 x daily - 7 x weekly - 1-2 sets - 10 reps   ASSESSMENT:   CLINICAL IMPRESSION: Continued improvement in Rt hip strength as measured.  Making gains in functional mobility and towards goals.  Continued skilled PT services warranted at this time.    OBJECTIVE IMPAIRMENTS: Abnormal gait, decreased activity tolerance, decreased balance, decreased coordination, decreased endurance, decreased mobility, difficulty walking, decreased strength, increased fascial restrictions, impaired perceived functional ability, increased muscle spasms, impaired flexibility, improper body mechanics, and pain.    ACTIVITY LIMITATIONS: carrying, lifting, bending, sitting, standing, squatting, stairs, transfers, bed mobility, and locomotion level   PARTICIPATION LIMITATIONS: meal prep, cleaning, laundry, interpersonal relationship, driving, shopping, community activity, occupation, and yard work   PERSONAL FACTORS:  HTN, history of Rt shoulder rotator cuff injury  are also affecting patient's functional outcome.    REHAB POTENTIAL: Good   CLINICAL DECISION MAKING: Stable/uncomplicated   EVALUATION COMPLEXITY: Low     GOALS: Goals reviewed with patient? Yes   SHORT TERM  GOALS: (target date for Short term goals are 3 weeks 06/29/2022)    1.  Patient will demonstrate independent use of home exercise program to maintain progress from in clinic treatments.  Goal status: Met    LONG TERM GOALS: (target dates for all long term goals are 12 weeks  08/31/2022 )   1. Patient will demonstrate/report pain at worst less than or equal to 2/10 to facilitate minimal limitation in daily activity secondary to pain symptoms.  Goal status: on going 06/29/2022   2. Patient will demonstrate independent use of home exercise program to facilitate ability to maintain/progress functional gains from skilled physical therapy services.  Goal status:  on going 06/29/2022   3. Patient will demonstrate FOTO outcome > or = 62 % to indicate reduced disability due to condition.  Goal status:  on going 06/29/2022   4.  Patient will demonstrate Rt LE MMT 5/5 throughout to faciltiate usual transfers, stairs, squatting at [facility] for daily life.   Goal status:  on going 06/29/2022   5.  Patient will demonstrate independent ambulation community distances s deviation > 500 ft for normal activity level.  Goal status:  on going 06/29/2022   6.  Patient will demonstrate ascending/descending stairs reciprocal gait pattern s UE assist for household and community navigation.  Goal status:  on going 06/29/2022   7.  Patient will demonstrate/report ability to return to work.  Goal Status:  on going 06/29/2022     PLAN:   PT FREQUENCY: 2x/week   PT DURATION: 12 weeks   PLANNED INTERVENTIONS: Therapeutic exercises, Therapeutic activity, Neuro Muscular re-education, Balance training, Gait training, Patient/Family education, Joint mobilization, Stair training, DME instructions, Dry Needling, Electrical stimulation, Traction, Cryotherapy, vasopneumatic deviceMoist heat, Taping, Ultrasound, Ionotophoresis 4mg /ml Dexamethasone, and Manual therapy.  All included unless contraindicated   PLAN FOR NEXT SESSION:Continue  strengthening improvements.    , PT, DPT, OCS, ATC 06/29/22  11:42 AM

## 2022-07-01 ENCOUNTER — Encounter: Payer: Self-pay | Admitting: Rehabilitative and Restorative Service Providers"

## 2022-07-01 ENCOUNTER — Ambulatory Visit: Payer: Medicare HMO | Admitting: Rehabilitative and Restorative Service Providers"

## 2022-07-01 DIAGNOSIS — R262 Difficulty in walking, not elsewhere classified: Secondary | ICD-10-CM

## 2022-07-01 DIAGNOSIS — M6281 Muscle weakness (generalized): Secondary | ICD-10-CM

## 2022-07-01 DIAGNOSIS — M25551 Pain in right hip: Secondary | ICD-10-CM | POA: Diagnosis not present

## 2022-07-01 NOTE — Therapy (Signed)
OUTPATIENT PHYSICAL THERAPY TREATMENT NOTE   Patient Name: Ellen Nelson MRN: [id number] DOB:[birth year], 67 y.o., female Today's Date: 07/01/2022  END OF SESSION:   PT End of Session - 07/01/22 1021     Visit Number 6    Number of Visits 24    Date for PT Re-Evaluation 08/31/22    Authorization Type [employer] $10 copay    Authorization - Visit Number 6    Authorization - Number of Visits 12    PT Start Time 1017    PT Stop Time 1056    PT Time Calculation (min) 39 min    Activity Tolerance Patient tolerated treatment well    Behavior During Therapy WFL for tasks assessed/performed                 Past Medical History:  Diagnosis Date   Allergy    Arthritis    Asthma    Hypertension    Mitral prolapse    Per patient   Rotator cuff tear    Right shoulder   Past Surgical History:  Procedure Laterality Date   ABDOMINAL HYSTERECTOMY     RETAINED PLACENTA REMOVAL     32 years ago per patient   TONSILLECTOMY     TOTAL HIP ARTHROPLASTY Right 02/15/2022   Procedure: RIGHT TOTAL HIP ARTHROPLASTY ANTERIOR APPROACH;  Surgeon: Ellen Kos, MD;  Location: [facility];  Service: Orthopedics;  Laterality: Right;  3-C   Patient Active Problem List   Diagnosis Date Noted   Status post total replacement of right hip 02/15/2022   Primary osteoarthritis of right hip 01/12/2022   Bilateral hand pain 11/21/2020   Positive ANA (antinuclear antibody) 11/21/2020   Nonspecific syndrome suggestive of viral illness 05/31/2020   Biceps tendinopathy of right upper extremity 12/04/2019   Complete tear of right rotator cuff 09/05/2019   Impingement syndrome of right shoulder 09/05/2019     THERAPY DIAG:  Pain in right hip  Muscle weakness (generalized)  Difficulty in walking, not elsewhere classified   PCP: Ellen Cloud MD   REFERRING PROVIDER: Cristie Hem, PA-C   REFERRING DIAG: [id number] (ICD-10-CM) - Status post total replacement of right hip   EVAL THERAPY DIAG:   Muscle weakness (generalized)   Pain in right hip   Difficulty in walking, not elsewhere classified   Rationale for Evaluation and Treatment: Rehabilitation   ONSET DATE: 02/16/2023 THA    SUBJECTIVE:    SUBJECTIVE STATEMENT: She indicated doing stairs at home to work on strength and no complaints with it.  Reported 4/10 at worst for groin and front of Rt leg at times.    PERTINENT HISTORY: HTN, history of Rt shoulder rotator cuff injury   PAIN:  NPRS scale: up to 4/10 Pain location: Rt hip/leg Pain description: sore Aggravating factors: bouncing child on leg, sitting prolonged Relieving factors: get up and move around, ice   PRECAUTIONS: Anterior hip   WEIGHT BEARING RESTRICTIONS: No   FALLS:  Has patient fallen in last 6 months? No   LIVING ENVIRONMENT:   Lives in: House/apartment Stairs: Flight of stairs to bedroom Has following equipment at home: [facility] with upper arm support posterioly   OCCUPATION: Runs [employer] with standing/walking all day - 7 x week sometimes   PLOF: Independent, work, household activity   PATIENT GOALS: Reduce pain, get strengthening to get back to work.      OBJECTIVE:    PATIENT SURVEYS:  06/08/2022 FOTO intake: 51   predicted:  62   POSTURE:  06/08/2022 Mild reduction of lumbar lordosis in standing, WB deviation to Lt leg mildly in stance   PALPATION: 06/08/2022 Tenderness to touch c trigger points, myofascial guarding throughout anterior Rt hip/thigh, lateral hip/thigh     LOWER EXTREMITY MMT:   MMT Right 06/08/2022 Left 06/08/2022 Right 06/18/2022 Right 06/29/2022  Hip flexion 3+/5 5/5 4/5 c pain 5/5 c "sore"  Hip extension        Hip abduction Resisted static testing sitting weak Resisted static testing sitting strong    Knee flexion 5/5 5/5    Knee extension 4/5 5/5     (Blank rows = not tested)   FUNCTIONAL TESTS:  06/08/2022 18 inch chair transfer: on first try, increased difficulty c pain with weight shift  to Lt side  .  Lt SLS:  10 seconds Rt SLS: < 3 seconds s loss of balance   TUG with cane: 14.8 seconds.  Without cane: 13.2 seconds   GAIT: 06/08/2022 SPC use in clinic household distances.  Able to perform independent as well.      TODAY'S TREATMENT                                                                DATE: 07/01/2022 TherEx NuStep L6 x 10 min Leg press double leg 75 lbs x 15, single leg 2 x 15 37 lbs , performed bilaterally Step on over and down 6 inch step x 16 WB on Rt with light single handrail assist Seated Rt quad set c SLR x 20 with intermittent breaks  Neuro Re-ed SLS 6 inch hurdle clear forward x 15 bilateral occasional hand assist on foam c SBA   TODAY'S TREATMENT                                                                DATE: 06/29/2022 TherEx NuStep L6 x 10 min Leg press double leg 75 lbs x 15, single leg 2 x 15 37 lbs , performed bilaterally Step down 6 inch x 15 WB on Rt leg (cues for home use) Sit to stand to sit 18 inch chair x 10 slow lowering focus  Seated Rt quad set 5 sec hold x 10  Seated Rt quad set c SLR 2 x 5   Neuro Re-ed SLS 9 inch hurdle clear forward x 15 bilateral occasional hand assist.  Lateral hurdle stepping 9 inch one hurdle x 15 each way   TODAY'S TREATMENT                                                                DATE: 06/22/2022 TherEx NuStep L6 x 10 min Leg press double leg 75 lbs x 15, single leg x 15 37 lbs , performed bilaterally   Neuro Re-ed Pre gait step over 4 inch step c fwd/retro  weight shift on stance leg x 15 bilateral  Fitter board rocker fwd/back 30x c light touch focus Lateral hurdle stepping 6 inch hurdles 10 ft x 4 each way in // bars Fwd hurdle stepping 6 inch 10 ft in // bars x 4 each leg leading, performed bilaterally   PATIENT EDUCATION:  06/08/2022 Education details: HEP, POC - education on gym options/silver sneakers importance Person educated: Patient Education method: Explanation,  Demonstration, Verbal cues, and Handouts Education comprehension: verbalized understanding, returned demonstration, and verbal cues required   HOME EXERCISE PROGRAM: Access Code: [place] URL: [email or web address] Date: 07/01/2022 Prepared by: Ellen Nelson  Exercises - Seated Quad Set  - 3-5 x daily - 7 x weekly - 1 sets - 10 reps - 5 hold - Seated Hip Abduction with Resistance  - 1-2 x daily - 7 x weekly - 3 sets - 10 reps - Hooklying Clamshell with Resistance  - 1-2 x daily - 7 x weekly - 3 sets - 10 reps - Sit to Stand  - 3 x daily - 7 x weekly - 1 sets - 10 reps - Seated Straight Leg Heel Taps  - 1-2 x daily - 7 x weekly - 1-2 sets - 5-10 reps   ASSESSMENT:   CLINICAL IMPRESSION: Compliant surface balance control fair today c close SBA guarding necessary to ensure safety.  Hip strength continued to improve but continued skilled PT services warranted. Due to overall improvements, 1x/week transitioning appropriate paired c HEP.    OBJECTIVE IMPAIRMENTS: Abnormal gait, decreased activity tolerance, decreased balance, decreased coordination, decreased endurance, decreased mobility, difficulty walking, decreased strength, increased fascial restrictions, impaired perceived functional ability, increased muscle spasms, impaired flexibility, improper body mechanics, and pain.    ACTIVITY LIMITATIONS: carrying, lifting, bending, sitting, standing, squatting, stairs, transfers, bed mobility, and locomotion level   PARTICIPATION LIMITATIONS: meal prep, cleaning, laundry, interpersonal relationship, driving, shopping, community activity, occupation, and yard work   PERSONAL FACTORS:  HTN, history of Rt shoulder rotator cuff injury  are also affecting patient's functional outcome.    REHAB POTENTIAL: Good   CLINICAL DECISION MAKING: Stable/uncomplicated   EVALUATION COMPLEXITY: Low     GOALS: Goals reviewed with patient? Yes   SHORT TERM GOALS: (target date for Short  term goals are 3 weeks 06/29/2022)    1.  Patient will demonstrate independent use of home exercise program to maintain progress from in clinic treatments.  Goal status: Met    LONG TERM GOALS: (target dates for all long term goals are 12 weeks  08/31/2022 )   1. Patient will demonstrate/report pain at worst less than or equal to 2/10 to facilitate minimal limitation in daily activity secondary to pain symptoms.  Goal status: on going 06/29/2022   2. Patient will demonstrate independent use of home exercise program to facilitate ability to maintain/progress functional gains from skilled physical therapy services.  Goal status:  on going 06/29/2022   3. Patient will demonstrate FOTO outcome > or = 62 % to indicate reduced disability due to condition.  Goal status:  on going 06/29/2022   4.  Patient will demonstrate Rt LE MMT 5/5 throughout to faciltiate usual transfers, stairs, squatting at [facility] for daily life.   Goal status:  on going 06/29/2022   5.  Patient will demonstrate independent ambulation community distances s deviation > 500 ft for normal activity level.  Goal status:  on going 06/29/2022   6.  Patient will demonstrate ascending/descending stairs reciprocal gait pattern s UE assist for household  and community navigation.  Goal status:  on going 06/29/2022   7.  Patient will demonstrate/report ability to return to work.  Goal Status:  on going 06/29/2022     PLAN:   PT FREQUENCY: 2x/week   PT DURATION: 12 weeks   PLANNED INTERVENTIONS: Therapeutic exercises, Therapeutic activity, Neuro Muscular re-education, Balance training, Gait training, Patient/Family education, Joint mobilization, Stair training, DME instructions, Dry Needling, Electrical stimulation, Traction, Cryotherapy, vasopneumatic deviceMoist heat, Taping, Ultrasound, Ionotophoresis 4mg /ml Dexamethasone, and Manual therapy.  All included unless contraindicated   PLAN FOR NEXT SESSION:Continue strengthening improvements and  compliant surface balance.    Ellen Nelson, PT, DPT, OCS, ATC 07/01/22  10:52 AM

## 2022-07-05 ENCOUNTER — Ambulatory Visit: Payer: Medicare HMO | Admitting: Podiatry

## 2022-07-06 ENCOUNTER — Encounter: Payer: Self-pay | Admitting: Rehabilitative and Restorative Service Providers"

## 2022-07-06 ENCOUNTER — Ambulatory Visit: Payer: Medicare HMO | Admitting: Rehabilitative and Restorative Service Providers"

## 2022-07-06 ENCOUNTER — Telehealth: Payer: Self-pay | Admitting: Orthopaedic Surgery

## 2022-07-06 DIAGNOSIS — R262 Difficulty in walking, not elsewhere classified: Secondary | ICD-10-CM

## 2022-07-06 DIAGNOSIS — M25551 Pain in right hip: Secondary | ICD-10-CM

## 2022-07-06 DIAGNOSIS — M6281 Muscle weakness (generalized): Secondary | ICD-10-CM

## 2022-07-06 NOTE — Telephone Encounter (Signed)
She's about 5-6 months from surgery.  At this time, she has no restrictions.  Thanks.

## 2022-07-06 NOTE — Telephone Encounter (Signed)
We can extend restrictions for 3 more months.

## 2022-07-06 NOTE — Telephone Encounter (Signed)
Patient request a call regarding her returning to work with restrictions. Patient call back number [phone].

## 2022-07-06 NOTE — Telephone Encounter (Signed)
I have written note and notified patient. It is up front and ready for pick up.

## 2022-07-06 NOTE — Therapy (Addendum)
OUTPATIENT PHYSICAL THERAPY TREATMENT NOTE   Patient Name: Ellen Nelson MRN: [id number] DOB:November 12, 1955, 67 y.o., female Today's Date: 07/06/2022  END OF SESSION:   PT End of Session - 07/06/22 1112     Visit Number 7    Number of Visits 24    Date for PT Re-Evaluation 08/31/22    Authorization Type [employer] $10 copay    Authorization - Visit Number 7    Authorization - Number of Visits 12    PT Start Time 1101    PT Stop Time 1140    PT Time Calculation (min) 39 min    Activity Tolerance Patient tolerated treatment well    Behavior During Therapy WFL for tasks assessed/performed              Past Medical History:  Diagnosis Date   Allergy    Arthritis    Asthma    Hypertension    Mitral prolapse    Per patient   Rotator cuff tear    Right shoulder   Past Surgical History:  Procedure Laterality Date   ABDOMINAL HYSTERECTOMY     RETAINED PLACENTA REMOVAL     32 years ago per patient   TONSILLECTOMY     TOTAL HIP ARTHROPLASTY Right 02/15/2022   Procedure: RIGHT TOTAL HIP ARTHROPLASTY ANTERIOR APPROACH;  Surgeon: Leandrew Koyanagi, MD;  Location: [facility];  Service: Orthopedics;  Laterality: Right;  3-C   Patient Active Problem List   Diagnosis Date Noted   Status post total replacement of right hip 02/15/2022   Primary osteoarthritis of right hip 01/12/2022   Bilateral hand pain 11/21/2020   Positive ANA (antinuclear antibody) 11/21/2020   Nonspecific syndrome suggestive of viral illness 05/31/2020   Biceps tendinopathy of right upper extremity 12/04/2019   Complete tear of right rotator cuff 09/05/2019   Impingement syndrome of right shoulder 09/05/2019     THERAPY DIAG:  Pain in right hip  Muscle weakness (generalized)  Difficulty in walking, not elsewhere classified   PCP: Charolette Forward MD   REFERRING PROVIDER: Aundra Dubin, PA-C   REFERRING DIAG: [phone] (ICD-10-CM) - Status post total replacement of right hip   EVAL THERAPY DIAG:  Muscle  weakness (generalized)   Pain in right hip   Difficulty in walking, not elsewhere classified   Rationale for Evaluation and Treatment: Rehabilitation   ONSET DATE: 02/16/2023 THA    SUBJECTIVE:    SUBJECTIVE STATEMENT: She continued to report improvements.  She mentioned a little pain on front of Rt hip today upon arrival.  Carrying cane at times.    PERTINENT HISTORY: HTN, history of Rt shoulder rotator cuff injury   PAIN:  NPRS scale: 5/10 upon waking and less now.  Pain location: Rt hip/leg Pain description: sore Aggravating factors: walking up this morning Relieving factors: get up and move around, ice   PRECAUTIONS: Anterior hip   WEIGHT BEARING RESTRICTIONS: No   FALLS:  Has patient fallen in last 6 months? No   LIVING ENVIRONMENT:   Lives in: House/apartment Stairs: Flight of stairs to bedroom Has following equipment at home: [facility] with upper arm support posterioly   OCCUPATION: Runs [employer] with standing/walking all day - 7 x week sometimes   PLOF: Independent, work, household activity   PATIENT GOALS: Reduce pain, get strengthening to get back to work.      OBJECTIVE:    PATIENT SURVEYS:   06/08/2022 FOTO intake: 51   predicted:  62   POSTURE:  06/08/2022 Mild reduction of lumbar lordosis in standing, WB deviation to Lt leg mildly in stance   PALPATION: 06/08/2022 Tenderness to touch c trigger points, myofascial guarding throughout anterior Rt hip/thigh, lateral hip/thigh     LOWER EXTREMITY MMT:   MMT Right 06/08/2022 Left 06/08/2022 Right 06/18/2022 Right 06/29/2022  Hip flexion 3+/5 5/5 4/5 c pain 5/5 c "sore"  Hip extension        Hip abduction Resisted static testing sitting weak Resisted static testing sitting strong    Knee flexion 5/5 5/5    Knee extension 4/5 5/5     (Blank rows = not tested)   FUNCTIONAL TESTS:  06/08/2022 18 inch chair transfer: on first try, increased difficulty c pain with weight shift to Lt side  .  Lt  SLS:  10 seconds Rt SLS: < 3 seconds s loss of balance   TUG with cane: 14.8 seconds.  Without cane: 13.2 seconds   GAIT: 07/06/2022:  independent ambulation within clinic.   06/08/2022 SPC use in clinic household distances.  Able to perform independent as well.      TODAY'S TREATMENT                                                                DATE: 07/06/2022 TherEx NuStep L6 x 12 min Leg press double leg 81 lbs x 15, single leg 2 x 15 37 lbs , performed bilaterally Step on over and down 6 inch step x 15 WB on Rt with light single handrail assist Lateral step down WB on Rt leg 6 inch 2 x 10 with light hand rail assist bilaterally Standing combo hip ext/abd green band around ankles 2 x 10 bilateral single hand assist on bar    TODAY'S TREATMENT                                                                DATE: 07/01/2022 TherEx NuStep L6 x 10 min Leg press double leg 75 lbs x 15, single leg 2 x 15 37 lbs , performed bilaterally Step on over and down 6 inch step x 16 WB on Rt with light single handrail assist Seated Rt quad set c SLR x 20 with intermittent breaks  Neuro Re-ed SLS 6 inch hurdle clear forward x 15 bilateral occasional hand assist on foam c SBA   TODAY'S TREATMENT                                                                DATE: 06/29/2022 TherEx NuStep L6 x 10 min Leg press double leg 75 lbs x 15, single leg 2 x 15 37 lbs , performed bilaterally Step down 6 inch x 15 WB on Rt leg (cues for home use) Sit to stand to sit 18 inch chair x 10 slow lowering focus  Seated Rt  quad set 5 sec hold x 10  Seated Rt quad set c SLR 2 x 5   Neuro Re-ed SLS 9 inch hurdle clear forward x 15 bilateral occasional hand assist.  Lateral hurdle stepping 9 inch one hurdle x 15 each way   PATIENT EDUCATION:  06/08/2022 Education details: HEP, POC - education on gym options/silver sneakers importance Person educated: Patient Education method: Explanation, Demonstration, Verbal  cues, and Handouts Education comprehension: verbalized understanding, returned demonstration, and verbal cues required   HOME EXERCISE PROGRAM: Access Code: [email or web address] URL: [email or web address] Date: 07/01/2022 Prepared by: Scot Jun  Exercises - Seated Quad Set  - 3-5 x daily - 7 x weekly - 1 sets - 10 reps - 5 hold - Seated Hip Abduction with Resistance  - 1-2 x daily - 7 x weekly - 3 sets - 10 reps - Hooklying Clamshell with Resistance  - 1-2 x daily - 7 x weekly - 3 sets - 10 reps - Sit to Stand  - 3 x daily - 7 x weekly - 1 sets - 10 reps - Seated Straight Leg Heel Taps  - 1-2 x daily - 7 x weekly - 1-2 sets - 5-10 reps   ASSESSMENT:   CLINICAL IMPRESSION: Some complaints with hip movement to neutral/slight extension on Rt but gone when resting not in stretch.  Continued improvement in stability in ambulation with no cane use while in clinic safely with good control.  Working towards return to work and progression towards [employer] in future.  Continued skilled PT services warranted at this time, 1x/week now.    OBJECTIVE IMPAIRMENTS: Abnormal gait, decreased activity tolerance, decreased balance, decreased coordination, decreased endurance, decreased mobility, difficulty walking, decreased strength, increased fascial restrictions, impaired perceived functional ability, increased muscle spasms, impaired flexibility, improper body mechanics, and pain.    ACTIVITY LIMITATIONS: carrying, lifting, bending, sitting, standing, squatting, stairs, transfers, bed mobility, and locomotion level   PARTICIPATION LIMITATIONS: meal prep, cleaning, laundry, interpersonal relationship, driving, shopping, community activity, occupation, and yard work   PERSONAL FACTORS:  HTN, history of Rt shoulder rotator cuff injury  are also affecting patient's functional outcome.    REHAB POTENTIAL: Good   CLINICAL DECISION MAKING: Stable/uncomplicated   EVALUATION COMPLEXITY: Low      GOALS: Goals reviewed with patient? Yes   SHORT TERM GOALS: (target date for Short term goals are 3 weeks 06/29/2022)    1.  Patient will demonstrate independent use of home exercise program to maintain progress from in clinic treatments.  Goal status: Met    LONG TERM GOALS: (target dates for all long term goals are 12 weeks  08/31/2022 )   1. Patient will demonstrate/report pain at worst less than or equal to 2/10 to facilitate minimal limitation in daily activity secondary to pain symptoms.  Goal status: on going 06/29/2022   2. Patient will demonstrate independent use of home exercise program to facilitate ability to maintain/progress functional gains from skilled physical therapy services.  Goal status:  on going 06/29/2022   3. Patient will demonstrate FOTO outcome > or = 62 % to indicate reduced disability due to condition.  Goal status:  on going 06/29/2022   4.  Patient will demonstrate Rt LE MMT 5/5 throughout to faciltiate usual transfers, stairs, squatting at [facility] for daily life.   Goal status:  on going 06/29/2022   5.  Patient will demonstrate independent ambulation community distances s deviation > 500 ft for normal activity level.  Goal status:  on going  06/29/2022   6.  Patient will demonstrate ascending/descending stairs reciprocal gait pattern s UE assist for household and community navigation.  Goal status:  on going 06/29/2022   7.  Patient will demonstrate/report ability to return to work.  Goal Status:  on going 06/29/2022     PLAN:   PT FREQUENCY: 2x/week   PT DURATION: 12 weeks   PLANNED INTERVENTIONS: Therapeutic exercises, Therapeutic activity, Neuro Muscular re-education, Balance training, Gait training, Patient/Family education, Joint mobilization, Stair training, DME instructions, Dry Needling, Electrical stimulation, Traction, Cryotherapy, vasopneumatic deviceMoist heat, Taping, Ultrasound, Ionotophoresis 52m/ml Dexamethasone, and Manual therapy.  All included  unless contraindicated   PLAN FOR NEXT SESSION: Dynamic balance, compliant surface balance.  Continued strengthening improvement.s    MScot Jun PT, DPT, OCS, ATC 07/06/22  11:39 AM

## 2022-07-08 ENCOUNTER — Encounter: Payer: Medicare HMO | Admitting: Rehabilitative and Restorative Service Providers"

## 2022-07-15 ENCOUNTER — Ambulatory Visit: Payer: Medicare HMO | Admitting: Physical Therapy

## 2022-07-15 ENCOUNTER — Encounter: Payer: Self-pay | Admitting: Physical Therapy

## 2022-07-15 DIAGNOSIS — M25551 Pain in right hip: Secondary | ICD-10-CM

## 2022-07-15 DIAGNOSIS — R262 Difficulty in walking, not elsewhere classified: Secondary | ICD-10-CM

## 2022-07-15 DIAGNOSIS — M6281 Muscle weakness (generalized): Secondary | ICD-10-CM | POA: Diagnosis not present

## 2022-07-15 NOTE — Therapy (Signed)
OUTPATIENT PHYSICAL THERAPY TREATMENT NOTE   Patient Name: Ellen Nelson MRN: [id number] DOB:24-Dec-1955, 67 y.o., female Today's Date: 07/15/2022  END OF SESSION:   PT End of Session - 07/15/22 1437     Visit Number 8    Number of Visits 24    Date for PT Re-Evaluation 08/31/22    Authorization Type [employer] $10 copay    Authorization - Number of Visits 12    PT Start Time 1435    PT Stop Time 1513    PT Time Calculation (min) 38 min    Activity Tolerance Patient tolerated treatment well    Behavior During Therapy WFL for tasks assessed/performed               Past Medical History:  Diagnosis Date   Allergy    Arthritis    Asthma    Hypertension    Mitral prolapse    Per patient   Rotator cuff tear    Right shoulder   Past Surgical History:  Procedure Laterality Date   ABDOMINAL HYSTERECTOMY     RETAINED PLACENTA REMOVAL     32 years ago per patient   TONSILLECTOMY     TOTAL HIP ARTHROPLASTY Right 02/15/2022   Procedure: RIGHT TOTAL HIP ARTHROPLASTY ANTERIOR APPROACH;  Surgeon: Leandrew Koyanagi, MD;  Location: [place];  Service: Orthopedics;  Laterality: Right;  3-C   Patient Active Problem List   Diagnosis Date Noted   Status post total replacement of right hip 02/15/2022   Primary osteoarthritis of right hip 01/12/2022   Bilateral hand pain 11/21/2020   Positive ANA (antinuclear antibody) 11/21/2020   Nonspecific syndrome suggestive of viral illness 05/31/2020   Biceps tendinopathy of right upper extremity 12/04/2019   Complete tear of right rotator cuff 09/05/2019   Impingement syndrome of right shoulder 09/05/2019     THERAPY DIAG:  Pain in right hip  Muscle weakness (generalized)  Difficulty in walking, not elsewhere classified   PCP: Charolette Forward MD   REFERRING PROVIDER: Aundra Dubin, PA-C   REFERRING DIAG: [phone] (ICD-10-CM) - Status post total replacement of right hip   EVAL THERAPY DIAG:  Muscle weakness (generalized)   Pain in  right hip   Difficulty in walking, not elsewhere classified   Rationale for Evaluation and Treatment: Rehabilitation   ONSET DATE: 02/16/2023 THA    SUBJECTIVE:    SUBJECTIVE STATEMENT: Pt is back to work and feeling more pain lately.    PERTINENT HISTORY: HTN, history of Rt shoulder rotator cuff injury   PAIN:  NPRS scale: 6/10   Pain location: Rt hip/leg Pain description: sore Aggravating factors: walking up this morning Relieving factors: get up and move around, ice   PRECAUTIONS: Anterior hip   WEIGHT BEARING RESTRICTIONS: No   FALLS:  Has patient fallen in last 6 months? No   LIVING ENVIRONMENT:   Lives in: House/apartment Stairs: Flight of stairs to bedroom Has following equipment at home: [facility] with upper arm support posterioly   OCCUPATION: Runs [employer] with standing/walking all day - 7 x week sometimes   PLOF: Independent, work, household activity   PATIENT GOALS: Reduce pain, get strengthening to get back to work.      OBJECTIVE:    PATIENT SURVEYS:   06/08/2022 FOTO intake: 51   predicted:  62   POSTURE:  06/08/2022 Mild reduction of lumbar lordosis in standing, WB deviation to Lt leg mildly in stance   PALPATION: 06/08/2022 Tenderness to touch c trigger  points, myofascial guarding throughout anterior Rt hip/thigh, lateral hip/thigh     LOWER EXTREMITY MMT:   MMT Right 06/08/2022 Left 06/08/2022 Right 06/18/2022 Right 06/29/2022  Hip flexion 3+/5 5/5 4/5 c pain 5/5 c "sore"  Hip extension        Hip abduction Resisted static testing sitting weak Resisted static testing sitting strong    Knee flexion 5/5 5/5    Knee extension 4/5 5/5     (Blank rows = not tested)   FUNCTIONAL TESTS:  06/08/2022 18 inch chair transfer: on first try, increased difficulty c pain with weight shift to Lt side  .  Lt SLS:  10 seconds Rt SLS: < 3 seconds s loss of balance   TUG with cane: 14.8 seconds.  Without cane: 13.2 seconds   GAIT: 07/06/2022:   independent ambulation within clinic.   06/08/2022 SPC use in clinic household distances.  Able to perform independent as well.      TODAY'S TREATMENT                                                                DATE: 07/15/2022 TherEx NuStep L6 x 8 min Leg press double leg 81 lbs 2x10, single leg 2x15 37 lbs , performed bilaterally Lateral step ups onto 6" step x 10 reps bil; no UE support Step up and over on 6" step x 10 reps bil, no UE support Tandem walking with intermittent UE support x 6 laps in // bars Calf raises x 20 reps   TODAY'S TREATMENT                                                                DATE: 07/06/2022 TherEx NuStep L6 x 12 min Leg press double leg 81 lbs x 15, single leg 2 x 15 37 lbs , performed bilaterally Step on over and down 6 inch step x 15 WB on Rt with light single handrail assist Lateral step down WB on Rt leg 6 inch 2 x 10 with light hand rail assist bilaterally Standing combo hip ext/abd green band around ankles 2 x 10 bilateral single hand assist on bar    TODAY'S TREATMENT                                                                DATE: 07/01/2022 TherEx NuStep L6 x 10 min Leg press double leg 75 lbs x 15, single leg 2 x 15 37 lbs , performed bilaterally Step on over and down 6 inch step x 16 WB on Rt with light single handrail assist Seated Rt quad set c SLR x 20 with intermittent breaks  Neuro Re-ed SLS 6 inch hurdle clear forward x 15 bilateral occasional hand assist on foam c SBA   TODAY'S TREATMENT  DATE: 06/29/2022 TherEx NuStep L6 x 10 min Leg press double leg 75 lbs x 15, single leg 2 x 15 37 lbs , performed bilaterally Step down 6 inch x 15 WB on Rt leg (cues for home use) Sit to stand to sit 18 inch chair x 10 slow lowering focus  Seated Rt quad set 5 sec hold x 10  Seated Rt quad set c SLR 2 x 5   Neuro Re-ed SLS 9 inch hurdle clear forward x 15 bilateral  occasional hand assist.  Lateral hurdle stepping 9 inch one hurdle x 15 each way   PATIENT EDUCATION:  06/08/2022 Education details: HEP, POC - education on gym options/silver sneakers importance Person educated: Patient Education method: Explanation, Demonstration, Verbal cues, and Handouts Education comprehension: verbalized understanding, returned demonstration, and verbal cues required   HOME EXERCISE PROGRAM: Access Code: [place] URL: [email or web address] Date: 07/01/2022 Prepared by: Scot Jun  Exercises - Seated Quad Set  - 3-5 x daily - 7 x weekly - 1 sets - 10 reps - 5 hold - Seated Hip Abduction with Resistance  - 1-2 x daily - 7 x weekly - 3 sets - 10 reps - Hooklying Clamshell with Resistance  - 1-2 x daily - 7 x weekly - 3 sets - 10 reps - Sit to Stand  - 3 x daily - 7 x weekly - 1 sets - 10 reps - Seated Straight Leg Heel Taps  - 1-2 x daily - 7 x weekly - 1-2 sets - 5-10 reps   ASSESSMENT:   CLINICAL IMPRESSION: Pt tolerated session well today reporting decreased pain from 6 to 4/10 after session.  Recommended frequent position changes to help with pain.  Progressing well with PT.     OBJECTIVE IMPAIRMENTS: Abnormal gait, decreased activity tolerance, decreased balance, decreased coordination, decreased endurance, decreased mobility, difficulty walking, decreased strength, increased fascial restrictions, impaired perceived functional ability, increased muscle spasms, impaired flexibility, improper body mechanics, and pain.    ACTIVITY LIMITATIONS: carrying, lifting, bending, sitting, standing, squatting, stairs, transfers, bed mobility, and locomotion level   PARTICIPATION LIMITATIONS: meal prep, cleaning, laundry, interpersonal relationship, driving, shopping, community activity, occupation, and yard work   PERSONAL FACTORS:  HTN, history of Rt shoulder rotator cuff injury  are also affecting patient's functional outcome.    REHAB POTENTIAL:  Good   CLINICAL DECISION MAKING: Stable/uncomplicated   EVALUATION COMPLEXITY: Low     GOALS: Goals reviewed with patient? Yes   SHORT TERM GOALS: (target date for Short term goals are 3 weeks 06/29/2022)    1.  Patient will demonstrate independent use of home exercise program to maintain progress from in clinic treatments.  Goal status: Met    LONG TERM GOALS: (target dates for all long term goals are 12 weeks  08/31/2022 )   1. Patient will demonstrate/report pain at worst less than or equal to 2/10 to facilitate minimal limitation in daily activity secondary to pain symptoms.  Goal status: on going 06/29/2022   2. Patient will demonstrate independent use of home exercise program to facilitate ability to maintain/progress functional gains from skilled physical therapy services.  Goal status:  on going 06/29/2022   3. Patient will demonstrate FOTO outcome > or = 62 % to indicate reduced disability due to condition.  Goal status:  on going 06/29/2022   4.  Patient will demonstrate Rt LE MMT 5/5 throughout to faciltiate usual transfers, stairs, squatting at [facility] for daily life.   Goal status:  on going 06/29/2022  5.  Patient will demonstrate independent ambulation community distances s deviation > 500 ft for normal activity level.  Goal status:  on going 06/29/2022   6.  Patient will demonstrate ascending/descending stairs reciprocal gait pattern s UE assist for household and community navigation.  Goal status:  on going 06/29/2022   7.  Patient will demonstrate/report ability to return to work.  Goal Status:  on going 06/29/2022     PLAN:   PT FREQUENCY: 2x/week   PT DURATION: 12 weeks   PLANNED INTERVENTIONS: Therapeutic exercises, Therapeutic activity, Neuro Muscular re-education, Balance training, Gait training, Patient/Family education, Joint mobilization, Stair training, DME instructions, Dry Needling, Electrical stimulation, Traction, Cryotherapy, vasopneumatic deviceMoist heat,  Taping, Ultrasound, Ionotophoresis 38m/ml Dexamethasone, and Manual therapy.  All included unless contraindicated   PLAN FOR NEXT SESSION:  Dynamic balance, compliant surface balance.  Continued strengthening improvement.s    SLaureen Abrahams PT, DPT 07/15/22 3:14 PM

## 2022-07-22 ENCOUNTER — Encounter: Payer: Medicare HMO | Admitting: Rehabilitative and Restorative Service Providers"

## 2022-07-29 ENCOUNTER — Encounter: Payer: Self-pay | Admitting: Rehabilitative and Restorative Service Providers"

## 2022-07-29 ENCOUNTER — Ambulatory Visit: Payer: Medicare HMO | Admitting: Rehabilitative and Restorative Service Providers"

## 2022-07-29 DIAGNOSIS — M6281 Muscle weakness (generalized): Secondary | ICD-10-CM | POA: Diagnosis not present

## 2022-07-29 DIAGNOSIS — R262 Difficulty in walking, not elsewhere classified: Secondary | ICD-10-CM | POA: Diagnosis not present

## 2022-07-29 DIAGNOSIS — M25551 Pain in right hip: Secondary | ICD-10-CM | POA: Diagnosis not present

## 2022-07-29 NOTE — Therapy (Signed)
OUTPATIENT PHYSICAL THERAPY TREATMENT NOTE   Patient Name: Ellen Nelson MRN: [id number] DOB:1955-10-10, 67 y.o., female Today's Date: 07/29/2022  END OF SESSION:   PT End of Session - 07/29/22 1429     Visit Number 9    Number of Visits 24    Date for PT Re-Evaluation 08/31/22    Authorization Type [employer] $10 copay    Authorization - Visit Number 9    Authorization - Number of Visits 12    PT Start Time [id number]    PT Stop Time 1507    PT Time Calculation (min) 39 min    Activity Tolerance Patient limited by pain    Behavior During Therapy WFL for tasks assessed/performed                Past Medical History:  Diagnosis Date   Allergy    Arthritis    Asthma    Hypertension    Mitral prolapse    Per patient   Rotator cuff tear    Right shoulder   Past Surgical History:  Procedure Laterality Date   ABDOMINAL HYSTERECTOMY     RETAINED PLACENTA REMOVAL     32 years ago per patient   TONSILLECTOMY     TOTAL HIP ARTHROPLASTY Right 02/15/2022   Procedure: RIGHT TOTAL HIP ARTHROPLASTY ANTERIOR APPROACH;  Surgeon: Leandrew Koyanagi, MD;  Location: [place];  Service: Orthopedics;  Laterality: Right;  3-C   Patient Active Problem List   Diagnosis Date Noted   Status post total replacement of right hip 02/15/2022   Primary osteoarthritis of right hip 01/12/2022   Bilateral hand pain 11/21/2020   Positive ANA (antinuclear antibody) 11/21/2020   Nonspecific syndrome suggestive of viral illness 05/31/2020   Biceps tendinopathy of right upper extremity 12/04/2019   Complete tear of right rotator cuff 09/05/2019   Impingement syndrome of right shoulder 09/05/2019     THERAPY DIAG:  Pain in right hip  Muscle weakness (generalized)  Difficulty in walking, not elsewhere classified   PCP: Charolette Forward MD   REFERRING PROVIDER: Aundra Dubin, PA-C   REFERRING DIAG: [phone] (ICD-10-CM) - Status post total replacement of right hip   EVAL THERAPY DIAG:  Muscle weakness  (generalized)   Pain in right hip   Difficulty in walking, not elsewhere classified   Rationale for Evaluation and Treatment: Rehabilitation   ONSET DATE: 02/16/2023 THA    SUBJECTIVE:    SUBJECTIVE STATEMENT: She indicated doing well with the short day activity at work.  She said he had back pain with working.  Yesterday she had to work all day and that created pain increase and swelling response in Rt hip.  She stated someone didn't come in to allow her to get off.   PERTINENT HISTORY: HTN, history of Rt shoulder rotator cuff injury   PAIN:  NPRS scale: 5/10   Pain location: Rt hip/leg Pain description: sore Aggravating factors: walking up this morning Relieving factors: get up and move around, ice   PRECAUTIONS: Anterior hip   WEIGHT BEARING RESTRICTIONS: No   FALLS:  Has patient fallen in last 6 months? No   LIVING ENVIRONMENT:   Lives in: House/apartment Stairs: Flight of stairs to bedroom Has following equipment at home: [facility] with upper arm support posterioly   OCCUPATION: Runs [employer] with standing/walking all day - 7 x week sometimes   PLOF: Independent, work, household activity   PATIENT GOALS: Reduce pain, get strengthening to get back to work.  OBJECTIVE:    PATIENT SURVEYS:  07/29/2022:  FOTO update: 56  06/08/2022 FOTO intake: 51   predicted:  62   POSTURE:  06/08/2022 Mild reduction of lumbar lordosis in standing, WB deviation to Lt leg mildly in stance   PALPATION: 06/08/2022 Tenderness to touch c trigger points, myofascial guarding throughout anterior Rt hip/thigh, lateral hip/thigh     LOWER EXTREMITY MMT:   MMT Right 06/08/2022 Left 06/08/2022 Right 06/18/2022 Right 06/29/2022 Right 07/29/2022  Hip flexion 3+/5 5/5 4/5 c pain 5/5 c "sore"   Hip extension         Hip abduction Resisted static testing sitting weak Resisted static testing sitting strong     Knee flexion 5/5 5/5     Knee extension 4/5 5/5      (Blank rows = not  tested)   FUNCTIONAL TESTS:  06/08/2022 18 inch chair transfer: on first try, increased difficulty c pain with weight shift to Lt side  .  Lt SLS:  10 seconds Rt SLS: < 3 seconds s loss of balance   TUG with cane: 14.8 seconds.  Without cane: 13.2 seconds   GAIT: 07/29/2022:  Independent ambulation all the time without cane.  Deviation of stance on Rt leg due to symptoms today.   07/06/2022:  independent ambulation within clinic.   06/08/2022 SPC use in clinic household distances.  Able to perform independent as well.      TODAY'S TREATMENT                                                                DATE: 07/29/2022 TherEx Supine clam shell c isometric hold contralateral leg x 20 bilateral blue band Supine marching c blue band around knees x 15 bilateral Supine bridge c band around knees hold x 15 2-3 sec hold at top Standing lumbar extension x5 (cues for home use) NuStep L7 x 10 min UE/LE Seated Rt SLR x 10     TODAY'S TREATMENT                                                                DATE: 07/15/2022 TherEx NuStep L6 x 8 min Leg press double leg 81 lbs 2x10, single leg 2x15 37 lbs , performed bilaterally Lateral step ups onto 6" step x 10 reps bil; no UE support Step up and over on 6" step x 10 reps bil, no UE support Tandem walking with intermittent UE support x 6 laps in // bars Calf raises x 20 reps   TODAY'S TREATMENT                                                                DATE: 07/06/2022 TherEx NuStep L6 x 12 min Leg press double leg 81 lbs x 15, single leg 2 x 15 37 lbs , performed bilaterally Step  on over and down 6 inch step x 15 WB on Rt with light single handrail assist Lateral step down WB on Rt leg 6 inch 2 x 10 with light hand rail assist bilaterally Standing combo hip ext/abd green band around ankles 2 x 10 bilateral single hand assist on bar    TODAY'S TREATMENT                                                                DATE:  07/01/2022 TherEx NuStep L6 x 10 min Leg press double leg 75 lbs x 15, single leg 2 x 15 37 lbs , performed bilaterally Step on over and down 6 inch step x 16 WB on Rt with light single handrail assist Seated Rt quad set c SLR x 20 with intermittent breaks  Neuro Re-ed SLS 6 inch hurdle clear forward x 15 bilateral occasional hand assist on foam c SBA   TODAY'S TREATMENT                                                                DATE: 06/29/2022 TherEx NuStep L6 x 10 min Leg press double leg 75 lbs x 15, single leg 2 x 15 37 lbs , performed bilaterally Step down 6 inch x 15 WB on Rt leg (cues for home use) Sit to stand to sit 18 inch chair x 10 slow lowering focus  Seated Rt quad set 5 sec hold x 10  Seated Rt quad set c SLR 2 x 5   Neuro Re-ed SLS 9 inch hurdle clear forward x 15 bilateral occasional hand assist.  Lateral hurdle stepping 9 inch one hurdle x 15 each way   PATIENT EDUCATION:  07/29/2022 Education details: HEP update Person educated: Patient Education method: Consulting civil engineer, Demonstration, Verbal cues, and Handouts Education comprehension: verbalized understanding, returned demonstration, and verbal cues required   HOME EXERCISE PROGRAM: Access Code: [email or web address] URL: [email or web address] Date: 07/29/2022 Prepared by: Scot Jun  Exercises - Seated Quad Set  - 3-5 x daily - 7 x weekly - 1 sets - 10 reps - 5 hold - Seated Hip Abduction with Resistance  - 1-2 x daily - 7 x weekly - 3 sets - 10 reps - Hooklying Clamshell with Resistance  - 1-2 x daily - 7 x weekly - 3 sets - 10 reps - Sit to Stand  - 3 x daily - 7 x weekly - 1 sets - 10 reps - Seated Straight Leg Heel Taps  - 1-2 x daily - 7 x weekly - 1-2 sets - 5-10 reps - Supine Bridge with Resistance Band  - 1 x daily - 7 x weekly - 1-2 sets - 10-15 reps - 2-3 hold - Standing Lumbar Extension with Counter  - 3-5 x daily - 7 x weekly - 1 sets - 5-10 reps   ASSESSMENT:   CLINICAL  IMPRESSION: Arrival reports of increased symptoms from increased work standing activity yesterday prompted clinical reasoning of adjustment to NWB activity to avoid aggravating symptoms.  Overall Pt has still demonstrated improvements compared to  previous (FOTO improved today) but limitations in Rt hip strength and tolerance to prolonged standing activity impacts longer duration work activity. Continued skilled PT services indicated at this time.     OBJECTIVE IMPAIRMENTS: Abnormal gait, decreased activity tolerance, decreased balance, decreased coordination, decreased endurance, decreased mobility, difficulty walking, decreased strength, increased fascial restrictions, impaired perceived functional ability, increased muscle spasms, impaired flexibility, improper body mechanics, and pain.    ACTIVITY LIMITATIONS: carrying, lifting, bending, sitting, standing, squatting, stairs, transfers, bed mobility, and locomotion level   PARTICIPATION LIMITATIONS: meal prep, cleaning, laundry, interpersonal relationship, driving, shopping, community activity, occupation, and yard work   PERSONAL FACTORS:  HTN, history of Rt shoulder rotator cuff injury  are also affecting patient's functional outcome.    REHAB POTENTIAL: Good   CLINICAL DECISION MAKING: Stable/uncomplicated   EVALUATION COMPLEXITY: Low     GOALS: Goals reviewed with patient? Yes   SHORT TERM GOALS: (target date for Short term goals are 3 weeks 06/29/2022)    1.  Patient will demonstrate independent use of home exercise program to maintain progress from in clinic treatments.  Goal status: Met    LONG TERM GOALS: (target dates for all long term goals are 12 weeks  08/31/2022 )   1. Patient will demonstrate/report pain at worst less than or equal to 2/10 to facilitate minimal limitation in daily activity secondary to pain symptoms.  Goal status: on going 07/29/2022   2. Patient will demonstrate independent use of home exercise program to  facilitate ability to maintain/progress functional gains from skilled physical therapy services.  Goal status:  on going 07/29/2022   3. Patient will demonstrate FOTO outcome > or = 62 % to indicate reduced disability due to condition.  Goal status:  on going 07/29/2022   4.  Patient will demonstrate Rt LE MMT 5/5 throughout to faciltiate usual transfers, stairs, squatting at [facility] for daily life.   Goal status:  on going 07/29/2022   5.  Patient will demonstrate independent ambulation community distances s deviation > 500 ft for normal activity level.  Goal status:  on going 07/29/2022   6.  Patient will demonstrate ascending/descending stairs reciprocal gait pattern s UE assist for household and community navigation.  Goal status:  on going 07/29/2022   7.  Patient will demonstrate/report ability to return to work.  Goal Status:  on going 07/29/2022     PLAN:   PT FREQUENCY: 2x/week   PT DURATION: 12 weeks   PLANNED INTERVENTIONS: Therapeutic exercises, Therapeutic activity, Neuro Muscular re-education, Balance training, Gait training, Patient/Family education, Joint mobilization, Stair training, DME instructions, Dry Needling, Electrical stimulation, Traction, Cryotherapy, vasopneumatic deviceMoist heat, Taping, Ultrasound, Ionotophoresis '4mg'$ /ml Dexamethasone, and Manual therapy.  All included unless contraindicated   PLAN FOR NEXT SESSION:  WB strengthening as tolerated.     Scot Jun, PT, DPT, OCS, ATC 07/29/22  3:05 PM

## 2022-08-05 ENCOUNTER — Encounter: Payer: Medicare HMO | Admitting: Rehabilitative and Restorative Service Providers"

## 2022-08-12 ENCOUNTER — Ambulatory Visit: Payer: Medicare HMO | Admitting: Rehabilitative and Restorative Service Providers"

## 2022-08-12 ENCOUNTER — Encounter: Payer: Self-pay | Admitting: Rehabilitative and Restorative Service Providers"

## 2022-08-12 DIAGNOSIS — R262 Difficulty in walking, not elsewhere classified: Secondary | ICD-10-CM | POA: Diagnosis not present

## 2022-08-12 DIAGNOSIS — M25551 Pain in right hip: Secondary | ICD-10-CM | POA: Diagnosis not present

## 2022-08-12 DIAGNOSIS — M6281 Muscle weakness (generalized): Secondary | ICD-10-CM

## 2022-08-12 NOTE — Therapy (Signed)
OUTPATIENT PHYSICAL THERAPY TREATMENT NOTE [place]   Patient Name: Ellen Nelson MRN: [id number] DOB:06/13/1955, 67 y.o., female Today's Date: 08/12/2022   PHYSICAL THERAPY DISCHARGE SUMMARY  Visits from Start of Care: 10  Current functional level related to goals / functional outcomes: See note   Remaining deficits: See note   Education / Equipment: HEP  Patient goals were  mostly met . Patient is being discharged due to being pleased with the current functional level.   END OF SESSION:   PT End of Session - 08/12/22 1453     Visit Number 10    Number of Visits 24    Date for PT Re-Evaluation 08/31/22    Authorization Type [employer] $10 copay    Authorization - Number of Visits 12    PT Start Time [id number]    PT Stop Time 1507    PT Time Calculation (min) 16 min    Activity Tolerance Patient tolerated treatment well    Behavior During Therapy WFL for tasks assessed/performed                 Past Medical History:  Diagnosis Date   Allergy    Arthritis    Asthma    Hypertension    Mitral prolapse    Per patient   Rotator cuff tear    Right shoulder   Past Surgical History:  Procedure Laterality Date   ABDOMINAL HYSTERECTOMY     RETAINED PLACENTA REMOVAL     32 years ago per patient   TONSILLECTOMY     TOTAL HIP ARTHROPLASTY Right 02/15/2022   Procedure: RIGHT TOTAL HIP ARTHROPLASTY ANTERIOR APPROACH;  Surgeon: Leandrew Koyanagi, MD;  Location: [facility];  Service: Orthopedics;  Laterality: Right;  3-C   Patient Active Problem List   Diagnosis Date Noted   Status post total replacement of right hip 02/15/2022   Primary osteoarthritis of right hip 01/12/2022   Bilateral hand pain 11/21/2020   Positive ANA (antinuclear antibody) 11/21/2020   Nonspecific syndrome suggestive of viral illness 05/31/2020   Biceps tendinopathy of right upper extremity 12/04/2019   Complete tear of right rotator cuff 09/05/2019   Impingement syndrome of right shoulder  09/05/2019     THERAPY DIAG:  Pain in right hip  Muscle weakness (generalized)  Difficulty in walking, not elsewhere classified   PCP: Charolette Forward MD   REFERRING PROVIDER: Aundra Dubin, PA-C   REFERRING DIAG: [phone] (ICD-10-CM) - Status post total replacement of right hip   EVAL THERAPY DIAG:  Muscle weakness (generalized)   Pain in right hip   Difficulty in walking, not elsewhere classified   Rationale for Evaluation and Treatment: Rehabilitation   ONSET DATE: 02/16/2023 THA    SUBJECTIVE:    SUBJECTIVE STATEMENT: She indicated work has been getting better.  She indicated some tightness and soreness after standing on concrete.    PERTINENT HISTORY: HTN, history of Rt shoulder rotator cuff injury   PAIN:  NPRS scale: 2/10 at worst   Pain location: Rt hip/leg Pain description: sore Aggravating factors:after work Relieving factors: get up and move around, ice   PRECAUTIONS: Anterior hip   WEIGHT BEARING RESTRICTIONS: No   FALLS:  Has patient fallen in last 6 months? No   LIVING ENVIRONMENT:   Lives in: House/apartment Stairs: Flight of stairs to bedroom Has following equipment at home: [facility] with upper arm support posterioly   OCCUPATION: Runs [employer] with standing/walking all day - 7 x week sometimes  PLOF: Independent, work, household activity   PATIENT GOALS: Reduce pain, get strengthening to get back to work.      OBJECTIVE:    PATIENT SURVEYS:  08/12/2022:  FOTO update:  66  07/29/2022:  FOTO update: 56  06/08/2022 FOTO intake: 51   predicted:  62   POSTURE:  06/08/2022 Mild reduction of lumbar lordosis in standing, WB deviation to Lt leg mildly in stance   PALPATION: 06/08/2022 Tenderness to touch c trigger points, myofascial guarding throughout anterior Rt hip/thigh, lateral hip/thigh     LOWER EXTREMITY MMT:   MMT Right 06/08/2022 Left 06/08/2022 Right 06/18/2022 Right 06/29/2022 Right 07/29/2022  Hip flexion 3+/5 5/5  4/5 c pain 5/5 c "sore" 5/5  Hip extension         Hip abduction Resisted static testing sitting weak Resisted static testing sitting strong     Knee flexion 5/5 5/5   5/5  Knee extension 4/5 5/5   5/5   (Blank rows = not tested)   FUNCTIONAL TESTS:  06/08/2022 18 inch chair transfer: on first try, increased difficulty c pain with weight shift to Lt side  .  Lt SLS:  10 seconds Rt SLS: < 3 seconds s loss of balance   TUG with cane: 14.8 seconds.  Without cane: 13.2 seconds   GAIT: 07/29/2022:  Independent ambulation all the time without cane.  Deviation of stance on Rt leg due to symptoms today.   07/06/2022:  independent ambulation within clinic.   06/08/2022 SPC use in clinic household distances.  Able to perform independent as well.      TODAY'S TREATMENT                                                                DATE: 08/12/2022 TherEx Nustep Lvl 7 10 mins UE/LE  Verbal review of HEP c handout given and cues for use going forward upon discharge.    TODAY'S TREATMENT                                                                DATE: 07/29/2022 TherEx Supine clam shell c isometric hold contralateral leg x 20 bilateral blue band Supine marching c blue band around knees x 15 bilateral Supine bridge c band around knees hold x 15 2-3 sec hold at top Standing lumbar extension x5 (cues for home use) NuStep L7 x 10 min UE/LE Seated Rt SLR x 10     TODAY'S TREATMENT                                                                DATE: 07/15/2022 TherEx NuStep L6 x 8 min Leg press double leg 81 lbs 2x10, single leg 2x15 37 lbs , performed bilaterally Lateral step ups onto 6" step x 10 reps bil; no UE support Step up  and over on 6" step x 10 reps bil, no UE support Tandem walking with intermittent UE support x 6 laps in // bars Calf raises x 20 reps   TODAY'S TREATMENT                                                                DATE: 07/06/2022 TherEx NuStep L6 x 12  min Leg press double leg 81 lbs x 15, single leg 2 x 15 37 lbs , performed bilaterally Step on over and down 6 inch step x 15 WB on Rt with light single handrail assist Lateral step down WB on Rt leg 6 inch 2 x 10 with light hand rail assist bilaterally Standing combo hip ext/abd green band around ankles 2 x 10 bilateral single hand assist on bar    TODAY'S TREATMENT                                                                DATE: 07/01/2022 TherEx NuStep L6 x 10 min Leg press double leg 75 lbs x 15, single leg 2 x 15 37 lbs , performed bilaterally Step on over and down 6 inch step x 16 WB on Rt with light single handrail assist Seated Rt quad set c SLR x 20 with intermittent breaks  Neuro Re-ed SLS 6 inch hurdle clear forward x 15 bilateral occasional hand assist on foam c SBA    PATIENT EDUCATION:  07/29/2022 Education details: HEP update Person educated: Patient Education method: Consulting civil engineer, Demonstration, Verbal cues, and Handouts Education comprehension: verbalized understanding, returned demonstration, and verbal cues required   HOME EXERCISE PROGRAM: Access Code: [email or web address] URL: [email or web address] Date: 07/29/2022 Prepared by: Scot Jun  Exercises - Seated Quad Set  - 3-5 x daily - 7 x weekly - 1 sets - 10 reps - 5 hold - Seated Hip Abduction with Resistance  - 1-2 x daily - 7 x weekly - 3 sets - 10 reps - Hooklying Clamshell with Resistance  - 1-2 x daily - 7 x weekly - 3 sets - 10 reps - Sit to Stand  - 3 x daily - 7 x weekly - 1 sets - 10 reps - Seated Straight Leg Heel Taps  - 1-2 x daily - 7 x weekly - 1-2 sets - 5-10 reps - Supine Bridge with Resistance Band  - 1 x daily - 7 x weekly - 1-2 sets - 10-15 reps - 2-3 hold - Standing Lumbar Extension with Counter  - 3-5 x daily - 7 x weekly - 1 sets - 5-10 reps   ASSESSMENT:   CLINICAL IMPRESSION: Pt attended 10 visits overall during course of treatment.  See objective data for most recent  information.  Pt has returned to work, reported good improvement in functional ability/symptoms.  Pt was in agreement with plan to d/c to HEP at this time.     OBJECTIVE IMPAIRMENTS: Abnormal gait, decreased activity tolerance, decreased balance, decreased coordination, decreased endurance, decreased mobility, difficulty walking, decreased strength, increased fascial restrictions, impaired perceived functional ability, increased muscle spasms,  impaired flexibility, improper body mechanics, and pain.    ACTIVITY LIMITATIONS: carrying, lifting, bending, sitting, standing, squatting, stairs, transfers, bed mobility, and locomotion level   PARTICIPATION LIMITATIONS: meal prep, cleaning, laundry, interpersonal relationship, driving, shopping, community activity, occupation, and yard work   PERSONAL FACTORS:  HTN, history of Rt shoulder rotator cuff injury  are also affecting patient's functional outcome.    REHAB POTENTIAL: Good   CLINICAL DECISION MAKING: Stable/uncomplicated   EVALUATION COMPLEXITY: Low     GOALS: Goals reviewed with patient? Yes   SHORT TERM GOALS: (target date for Short term goals are 3 weeks 06/29/2022)    1.  Patient will demonstrate independent use of home exercise program to maintain progress from in clinic treatments.  Goal status: Met    LONG TERM GOALS: (target dates for all long term goals are 12 weeks  08/31/2022 )   1. Patient will demonstrate/report pain at worst less than or equal to 2/10 to facilitate minimal limitation in daily activity secondary to pain symptoms.  Goal status: met   2. Patient will demonstrate independent use of home exercise program to facilitate ability to maintain/progress functional gains from skilled physical therapy services.  Goal status:  met   3. Patient will demonstrate FOTO outcome > or = 62 % to indicate reduced disability due to condition.  Goal status:  met   4.  Patient will demonstrate Rt LE MMT 5/5 throughout to  faciltiate usual transfers, stairs, squatting at [facility] for daily life.   Goal status:  mostly met   5.  Patient will demonstrate independent ambulation community distances s deviation > 500 ft for normal activity level.  Goal status:  met   6.  Patient will demonstrate ascending/descending stairs reciprocal gait pattern s UE assist for household and community navigation.  Goal status:  met   7.  Patient will demonstrate/report ability to return to work.  Goal Status:  met     PLAN:   PT FREQUENCY: 2x/week   PT DURATION: 12 weeks   PLANNED INTERVENTIONS: Therapeutic exercises, Therapeutic activity, Neuro Muscular re-education, Balance training, Gait training, Patient/Family education, Joint mobilization, Stair training, DME instructions, Dry Needling, Electrical stimulation, Traction, Cryotherapy, vasopneumatic deviceMoist heat, Taping, Ultrasound, Ionotophoresis 4mg /ml Dexamethasone, and Manual therapy.  All included unless contraindicated   PLAN FOR NEXT SESSION:  Discharge to HEP.    Scot Jun, PT, DPT, OCS, ATC 08/12/22  3:12 PM

## 2022-08-19 ENCOUNTER — Encounter: Payer: Medicare HMO | Admitting: Rehabilitative and Restorative Service Providers"

## 2022-08-25 NOTE — Progress Notes (Unsigned)
Office Visit Note   Patient: Ellen Nelson           Date of Birth: 10-Dec-1955           MRN: [id number] Visit Date: 08/26/2022              Requested by: Charolette Forward, MD [phone] [address] [place],   [id number] PCP: Charolette Forward, MD   Assessment & Plan: Visit Diagnoses:  1. Status post total replacement of right hip     Plan: Ellen Nelson is doing well overall.  She will continue work on Psychiatrist.  Dental prophylaxis reinforced.  Recheck in 6 months with standing AP pelvis x-rays.  Follow-Up Instructions: Return in about 6 months (around 02/25/2023).   Orders:  Orders Placed This Encounter  Procedures   XR HIP UNILAT W OR W/O PELVIS 2-3 VIEWS RIGHT   Meds ordered this encounter  Medications   amoxicillin (AMOXIL) 500 MG capsule    Sig: Take 4 capsules (2,000 mg total) by mouth once for 1 dose.    Dispense:  4 capsule    Refill:  6      Procedures: No procedures performed   Clinical Data: No additional findings.   Subjective: Chief Complaint  Patient presents with   Right Hip - Follow-up    Right total hip arthroplasty 02/15/2022    HPI  Ellen Nelson is here for 6 months postop visit status post right total hip replacement.  She has completed PT.   she been back to work since mid February doing 4 hours a day.  She reports some muscular fatigue and discomfort with activity.  Review of Systems   Objective: Vital Signs: There were no vitals taken for this visit.  Physical Exam  Ortho Exam  Examination of the right hip shows fully healed surgical scar.  She has no hip or groin pain with passive range of motion.  Specialty Comments:  No specialty comments available.  Imaging: XR HIP UNILAT W OR W/O PELVIS 2-3 VIEWS RIGHT  Result Date: 08/26/2022 Stable right total hip replacement without complication.    PMFS History: Patient Active Problem List   Diagnosis Date Noted   Status post total replacement of right hip  02/15/2022   Primary osteoarthritis of right hip 01/12/2022   Bilateral hand pain 11/21/2020   Positive ANA (antinuclear antibody) 11/21/2020   Nonspecific syndrome suggestive of viral illness 05/31/2020   Biceps tendinopathy of right upper extremity 12/04/2019   Complete tear of right rotator cuff 09/05/2019   Impingement syndrome of right shoulder 09/05/2019   Past Medical History:  Diagnosis Date   Allergy    Arthritis    Asthma    Hypertension    Mitral prolapse    Per patient   Rotator cuff tear    Right shoulder    Family History  Problem Relation Age of Onset   Hypertension Mother    Hiatal hernia Mother    Heart disease Mother    Dementia Father    Hypertension Father    Cancer - Colon Father    Osteoarthritis Sister    Breast cancer Sister    Heart disease Brother    Early death Brother     Past Surgical History:  Procedure Laterality Date   ABDOMINAL HYSTERECTOMY     RETAINED PLACENTA REMOVAL     32 years ago per patient   TONSILLECTOMY     TOTAL HIP ARTHROPLASTY Right 02/15/2022   Procedure: RIGHT TOTAL  HIP ARTHROPLASTY ANTERIOR APPROACH;  Surgeon: Leandrew Koyanagi, MD;  Location: [place];  Service: Orthopedics;  Laterality: Right;  3-C   Social History   Occupational History   Not on file  Tobacco Use   Smoking status: Former    Types: Cigarettes    Quit date: 2008    Years since quitting: 16.2   Smokeless tobacco: Never  Vaping Use   Vaping Use: Never used  Substance and Sexual Activity   Alcohol use: Yes    Comment: seldom   Drug use: Never   Sexual activity: Not on file

## 2022-08-26 ENCOUNTER — Encounter: Payer: Self-pay | Admitting: Orthopaedic Surgery

## 2022-08-26 ENCOUNTER — Other Ambulatory Visit (INDEPENDENT_AMBULATORY_CARE_PROVIDER_SITE_OTHER): Payer: Medicare HMO

## 2022-08-26 ENCOUNTER — Ambulatory Visit: Payer: Medicare HMO | Admitting: Orthopaedic Surgery

## 2022-08-26 DIAGNOSIS — Z96641 Presence of right artificial hip joint: Secondary | ICD-10-CM

## 2022-08-26 MED ORDER — AMOXICILLIN 500 MG PO CAPS: 2000.0000 mg | ORAL_CAPSULE | Freq: Once | ORAL | 6 refills | Status: AC

## 2022-08-27 DIAGNOSIS — H2513 Age-related nuclear cataract, bilateral: Secondary | ICD-10-CM | POA: Diagnosis not present

## 2022-08-27 DIAGNOSIS — H04123 Dry eye syndrome of bilateral lacrimal glands: Secondary | ICD-10-CM | POA: Diagnosis not present

## 2022-09-28 ENCOUNTER — Ambulatory Visit: Payer: Medicare HMO | Admitting: Podiatry

## 2022-10-11 ENCOUNTER — Ambulatory Visit: Payer: Medicare HMO | Admitting: Podiatry

## 2022-10-11 DIAGNOSIS — R600 Localized edema: Secondary | ICD-10-CM | POA: Diagnosis not present

## 2022-10-11 DIAGNOSIS — B351 Tinea unguium: Secondary | ICD-10-CM

## 2022-10-11 MED ORDER — TERBINAFINE HCL 250 MG PO TABS: 250.0000 mg | ORAL_TABLET | Freq: Every day | ORAL | 0 refills | Status: AC

## 2022-10-12 NOTE — Progress Notes (Signed)
  Subjective:  Patient ID: Ellen Nelson, female    DOB: [birth year],  MRN: [id number]  Chief Complaint  Patient presents with   Nail Problem    bilateral great toenail fungus and ingrown( left getting worse)    67 y.o. female presents with the above complaint. History confirmed with patient.  She took the Lamisil but admits that she did not take it consistently and missed some doses, has not had it in several months.  Also dealing with some swelling in both legs  Objective:  Physical Exam: warm, good capillary refill, no trophic changes or ulcerative lesions, normal DP and PT pulses, normal sensory exam, and bilateral hallux nail onychomycosis involving 40% of nail plate.  Diffuse nonpitting nontender edema no evidence of DVT bilateral lower extremities        Assessment:   1. Onychomycosis   2. Peripheral edema      Plan:  Patient was evaluated and treated and all questions answered.  Unfortunately has had inadequate therapy, I recommended we retreat with Lamisil and advised her to use it consistently for the 90-day course new photograph taken.  Refill sent to pharmacy.  I will see back in a few months for follow-up.  She is also dealing with peripheral edema, she previously has worn compression stockings for these and advised her she may need a higher pressure reading up to 20 to 30 mmHg.  She will get these and let me know if she needs any further orders for this.    Return in about 3 months (around 01/11/2023) for follow up after nail fungus treatment.

## 2022-12-06 DIAGNOSIS — I2510 Atherosclerotic heart disease of native coronary artery without angina pectoris: Secondary | ICD-10-CM | POA: Diagnosis not present

## 2022-12-06 DIAGNOSIS — E782 Mixed hyperlipidemia: Secondary | ICD-10-CM | POA: Diagnosis not present

## 2022-12-06 DIAGNOSIS — J45909 Unspecified asthma, uncomplicated: Secondary | ICD-10-CM | POA: Diagnosis not present

## 2022-12-06 DIAGNOSIS — I10 Essential (primary) hypertension: Secondary | ICD-10-CM | POA: Diagnosis not present

## 2023-01-11 ENCOUNTER — Ambulatory Visit: Payer: Medicare HMO | Admitting: Podiatry

## 2023-02-09 ENCOUNTER — Ambulatory Visit: Payer: Medicare HMO | Admitting: Internal Medicine

## 2023-02-24 ENCOUNTER — Encounter: Payer: Self-pay | Admitting: Orthopaedic Surgery

## 2023-02-24 ENCOUNTER — Other Ambulatory Visit (INDEPENDENT_AMBULATORY_CARE_PROVIDER_SITE_OTHER): Payer: Medicare HMO

## 2023-02-24 ENCOUNTER — Ambulatory Visit: Payer: Medicare HMO | Admitting: Physician Assistant

## 2023-02-24 DIAGNOSIS — Z96641 Presence of right artificial hip joint: Secondary | ICD-10-CM | POA: Diagnosis not present

## 2023-02-24 NOTE — Progress Notes (Signed)
Post-Op Visit Note   Patient: Ellen Nelson           Date of Birth: June 24, 1955           MRN: [id number] Visit Date: 02/24/2023 PCP: Rinaldo Cloud, MD   Assessment & Plan:  Chief Complaint:  Chief Complaint  Patient presents with   Right Hip - Follow-up    Right total hip arthroplasty 02/15/2022   Visit Diagnoses:  1. Status post total replacement of right hip     Plan: Patient is a pleasant 67 year old female who comes in today 1 year status post right total hip replacement 02/15/2022.  She is doing well.  She notes occasional discomfort when she is on her feet all day, otherwise no complaints.  Examination of her right hip reveals painless hip flexion and logroll.  She is neurovascularly intact distally.  At this point, she will continue to advance with activity as tolerated.  Dental prophylaxis reinforced.  Follow-up in 1 year for repeat evaluation and AP pelvis x-rays.  Call with concerns or questions.  Follow-Up Instructions: Return in about 1 year (around 02/24/2024).   Orders:  Orders Placed This Encounter  Procedures   XR Pelvis 1-2 Views   No orders of the defined types were placed in this encounter.   Imaging: XR Pelvis 1-2 Views  Result Date: 02/24/2023 Well-seated prosthesis without complication   PMFS History: Patient Active Problem List   Diagnosis Date Noted   Status post total replacement of right hip 02/15/2022   Primary osteoarthritis of right hip 01/12/2022   Bilateral hand pain 11/21/2020   Positive ANA (antinuclear antibody) 11/21/2020   Nonspecific syndrome suggestive of viral illness 05/31/2020   Biceps tendinopathy of right upper extremity 12/04/2019   Complete tear of right rotator cuff 09/05/2019   Impingement syndrome of right shoulder 09/05/2019   Past Medical History:  Diagnosis Date   Allergy    Arthritis    Asthma    Hypertension    Mitral prolapse    Per patient   Rotator cuff tear    Right shoulder    Family History   Problem Relation Age of Onset   Hypertension Mother    Hiatal hernia Mother    Heart disease Mother    Dementia Father    Hypertension Father    Cancer - Colon Father    Osteoarthritis Sister    Breast cancer Sister    Heart disease Brother    Early death Brother     Past Surgical History:  Procedure Laterality Date   ABDOMINAL HYSTERECTOMY     RETAINED PLACENTA REMOVAL     32 years ago per patient   TONSILLECTOMY     TOTAL HIP ARTHROPLASTY Right 02/15/2022   Procedure: RIGHT TOTAL HIP ARTHROPLASTY ANTERIOR APPROACH;  Surgeon: Tarry Kos, MD;  Location: [facility];  Service: Orthopedics;  Laterality: Right;  3-C   Social History   Occupational History   Not on file  Tobacco Use   Smoking status: Former    Current packs/day: 0.00    Types: Cigarettes    Quit date: 2008    Years since quitting: 16.7   Smokeless tobacco: Never  Vaping Use   Vaping status: Never Used  Substance and Sexual Activity   Alcohol use: Yes    Comment: seldom   Drug use: Never   Sexual activity: Not on file

## 2023-02-25 ENCOUNTER — Ambulatory Visit: Payer: Medicare HMO | Admitting: Orthopaedic Surgery

## 2023-03-07 DIAGNOSIS — M150 Primary generalized (osteo)arthritis: Secondary | ICD-10-CM | POA: Diagnosis not present

## 2023-03-07 DIAGNOSIS — I10 Essential (primary) hypertension: Secondary | ICD-10-CM | POA: Diagnosis not present

## 2023-03-07 DIAGNOSIS — I2510 Atherosclerotic heart disease of native coronary artery without angina pectoris: Secondary | ICD-10-CM | POA: Diagnosis not present

## 2023-03-07 DIAGNOSIS — E782 Mixed hyperlipidemia: Secondary | ICD-10-CM | POA: Diagnosis not present

## 2023-04-25 DIAGNOSIS — E785 Hyperlipidemia, unspecified: Secondary | ICD-10-CM | POA: Diagnosis not present

## 2023-04-25 DIAGNOSIS — I10 Essential (primary) hypertension: Secondary | ICD-10-CM | POA: Diagnosis not present

## 2023-05-06 ENCOUNTER — Encounter: Payer: Self-pay | Admitting: Internal Medicine

## 2023-05-06 ENCOUNTER — Ambulatory Visit: Payer: Medicare HMO | Admitting: Internal Medicine

## 2023-05-06 VITALS — BP 118/70 | HR 98 | Ht 67.0 in | Wt 222.0 lb

## 2023-05-06 DIAGNOSIS — Z800 Family history of malignant neoplasm of digestive organs: Secondary | ICD-10-CM

## 2023-05-06 DIAGNOSIS — R159 Full incontinence of feces: Secondary | ICD-10-CM | POA: Diagnosis not present

## 2023-05-06 DIAGNOSIS — R151 Fecal smearing: Secondary | ICD-10-CM

## 2023-05-06 DIAGNOSIS — R195 Other fecal abnormalities: Secondary | ICD-10-CM

## 2023-05-06 MED ORDER — NA SULFATE-K SULFATE-MG SULF 17.5-3.13-1.6 GM/177ML PO SOLN: 1.0000 | Freq: Once | ORAL | 0 refills | Status: AC

## 2023-05-06 NOTE — Patient Instructions (Signed)
You have been scheduled for a colonoscopy. Please follow written instructions given to you at your visit today.   Please pick up your prep supplies at the pharmacy within the next 1-3 days.  If you use inhalers (even only as needed), please bring them with you on the day of your procedure.  DO NOT TAKE 7 DAYS PRIOR TO TEST- Trulicity (dulaglutide) Ozempic, Wegovy (semaglutide) Mounjaro (tirzepatide) Bydureon Bcise (exanatide extended release)  DO NOT TAKE 1 DAY PRIOR TO YOUR TEST Rybelsus (semaglutide) Adlyxin (lixisenatide) Victoza (liraglutide) Byetta (exanatide) ___________________________________________________________________________ _______________________________________________________  If your blood pressure at your visit was 140/90 or greater, please contact your primary care physician to follow up on this.  _______________________________________________________  If you are age 62 or older, your body mass index should be between 23-30. Your Body mass index is 34.77 kg/m. If this is out of the aforementioned range listed, please consider follow up with your Primary Care Provider.  If you are age 69 or younger, your body mass index should be between 19-25. Your Body mass index is 34.77 kg/m. If this is out of the aformentioned range listed, please consider follow up with your Primary Care Provider.   ________________________________________________________  The [place] [facility] providers would like to encourage you to use [facility] to communicate with providers for non-urgent requests or questions.  Due to long hold times on the telephone, sending your provider a message by [facility] may be a faster and more efficient way to get a response.  Please allow 48 business hours for a response.  Please remember that this is for non-urgent requests.  _______________________________________________________

## 2023-05-06 NOTE — Progress Notes (Signed)
HISTORY OF PRESENT ILLNESS:  Ellen Nelson is a 67 y.o. female, Production designer, theatre/television/film of [employer], who is sent today by her primary care provider regarding screening colonoscopy.  Patient tells me that she underwent screening colonoscopy at least 15 years ago.  Reports an unremarkable exam.  No polyps.  There is a family history of colon cancer in her father in his late 36s.  Patient herself has a negative GI review of systems except for occasional incontinence with loose stools which has improved when she increase his dietary fiber.  She reports having had recent blood work with her PCP which was unremarkable.  REVIEW OF SYSTEMS:  All non-GI ROS negative except for sinus and allergy trouble, arthritis, ankle swelling, voice change  Past Medical History:  Diagnosis Date   Allergy    Arthritis    Asthma    Hypertension    Mitral prolapse    Per patient   Rotator cuff tear    Right shoulder    Past Surgical History:  Procedure Laterality Date   ABDOMINAL HYSTERECTOMY     RETAINED PLACENTA REMOVAL     32 years ago per patient   TONSILLECTOMY     TOTAL HIP ARTHROPLASTY Right 02/15/2022   Procedure: RIGHT TOTAL HIP ARTHROPLASTY ANTERIOR APPROACH;  Surgeon: Tarry Kos, MD;  Location: [facility] OR;  Service: Orthopedics;  Laterality: Right;  3-C    Social History [name]  reports that she quit smoking about 16 years ago. Her smoking use included cigarettes. She has never used smokeless tobacco. She reports current alcohol use. She reports that she does not use drugs.  family history includes Breast cancer in her sister; Cancer - Colon in her father; Dementia in her father; Early death in her brother; Heart disease in her brother and mother; Hiatal hernia in her mother; Hypertension in her father and mother; Osteoarthritis in her sister.  Allergies  Allergen Reactions   Percocet [Oxycodone-Acetaminophen] Other (See Comments)    Patient states Percocet caused severe hallucinations.    Sulfa Antibiotics Hives       PHYSICAL EXAMINATION: Vital signs: BP 118/70   Pulse 98   Ht 5\' 7"  (1.702 m)   Wt 222 lb (100.7 kg)   BMI 34.77 kg/m   Constitutional: generally well-appearing, no acute distress Psychiatric: alert and oriented x3, cooperative Eyes: extraocular movements intact, anicteric, conjunctiva pink Mouth: oral pharynx moist, no lesions Neck: supple no lymphadenopathy Cardiovascular: heart regular rate and rhythm, no murmur Lungs: clear to auscultation bilaterally Abdomen: soft, nontender, nondistended, no obvious ascites, no peritoneal signs, normal bowel sounds, no organomegaly Rectal: Deferred until colonoscopy Extremities: no clubbing or cyanosis.  Trace lower extremity edema bilaterally Skin: no lesions on visible extremities Neuro: No focal deficits.  Cranial nerves intact  ASSESSMENT:  1.  Family history of colon cancer in her father (61s).  Overdue for high risk screening colonoscopy. 2.  Fecal incontinence with loose stools.  Improved with increased dietary fiber 3.  Patient requests a work note for her procedure.   PLAN:  1.  Colonoscopy.The nature of the procedure, as well as the risks, benefits, and alternatives were carefully and thoroughly reviewed with the patient. Ample time for discussion and questions allowed. The patient understood, was satisfied, and agreed to proceed. 2.  Can supplement with Citrucel fiber 3.  Work note provided A total time of 45 minutes was spent preparing to see the patient, obtaining comprehensive history, performing medically appropriate physical examination, counseling and educating  the patient regarding the above listed issues, ordering colonoscopy, and documenting clinical information in the health record

## 2023-05-07 DIAGNOSIS — Z1231 Encounter for screening mammogram for malignant neoplasm of breast: Secondary | ICD-10-CM | POA: Diagnosis not present

## 2023-05-07 LAB — HM MAMMOGRAPHY

## 2023-06-07 ENCOUNTER — Ambulatory Visit: Payer: Medicare HMO | Admitting: Internal Medicine

## 2023-06-07 ENCOUNTER — Encounter: Payer: Self-pay | Admitting: Internal Medicine

## 2023-06-07 VITALS — BP 128/77 | HR 82 | Temp 97.0°F | Resp 14 | Ht 67.0 in | Wt 222.0 lb

## 2023-06-07 DIAGNOSIS — K5730 Diverticulosis of large intestine without perforation or abscess without bleeding: Secondary | ICD-10-CM | POA: Diagnosis not present

## 2023-06-07 DIAGNOSIS — Z800 Family history of malignant neoplasm of digestive organs: Secondary | ICD-10-CM

## 2023-06-07 DIAGNOSIS — Z1211 Encounter for screening for malignant neoplasm of colon: Secondary | ICD-10-CM | POA: Diagnosis not present

## 2023-06-07 DIAGNOSIS — D122 Benign neoplasm of ascending colon: Secondary | ICD-10-CM

## 2023-06-07 DIAGNOSIS — R151 Fecal smearing: Secondary | ICD-10-CM

## 2023-06-07 DIAGNOSIS — I10 Essential (primary) hypertension: Secondary | ICD-10-CM | POA: Diagnosis not present

## 2023-06-07 MED ORDER — SODIUM CHLORIDE 0.9 % IV SOLN: 500.0000 mL | Freq: Once | INTRAVENOUS | Status: DC

## 2023-06-07 NOTE — Progress Notes (Signed)
 Called to room to assist during endoscopic procedure.  Patient ID and intended procedure confirmed with present staff. Received instructions for my participation in the procedure from the performing physician.

## 2023-06-07 NOTE — Progress Notes (Signed)
 Pt's states no medical or surgical changes since previsit or office visit.

## 2023-06-07 NOTE — Progress Notes (Signed)
 Report to PACU, RN, vss, BBS= Clear.

## 2023-06-07 NOTE — Patient Instructions (Addendum)
-  Handout on polyps, diverticulosis provided -await pathology results -repeat colonoscopy for surveillance recommended. Date to be determined when pathology result become available   -Continue present medications   YOU HAD AN ENDOSCOPIC PROCEDURE TODAY AT [facility]:   Refer to the procedure report that was given to you for any specific questions about what was found during the examination.  If the procedure report does not answer your questions, please call your gastroenterologist to clarify.  If you requested that your care partner not be given the details of your procedure findings, then the procedure report has been included in a sealed envelope for you to review at your convenience later.  YOU SHOULD EXPECT: Some feelings of bloating in the abdomen. Passage of more gas than usual.  Walking can help get rid of the air that was put into your GI tract during the procedure and reduce the bloating. If you had a lower endoscopy (such as a colonoscopy or flexible sigmoidoscopy) you may notice spotting of blood in your stool or on the toilet paper. If you underwent a bowel prep for your procedure, you may not have a normal bowel movement for a few days.  Please Note:  You might notice some irritation and congestion in your nose or some drainage.  This is from the oxygen used during your procedure.  There is no need for concern and it should clear up in a day or so.  SYMPTOMS TO REPORT IMMEDIATELY:  Following lower endoscopy (colonoscopy or flexible sigmoidoscopy):  Excessive amounts of blood in the stool  Significant tenderness or worsening of abdominal pains  Swelling of the abdomen that is new, acute  Fever of 100F or higher   For urgent or emergent issues, a gastroenterologist can be reached at any hour by calling [phone]. Do not use MyChart messaging for urgent concerns.    DIET:  We do recommend a small meal at first, but then you may proceed to your regular diet.   Drink plenty of fluids but you should avoid alcoholic beverages for 24 hours.  ACTIVITY:  You should plan to take it easy for the rest of today and you should NOT DRIVE or use heavy machinery until tomorrow (because of the sedation medicines used during the test).    FOLLOW UP: Our staff will call the number listed on your records the next business day following your procedure.  We will call around 7:15- 8:00 am to check on you and address any questions or concerns that you may have regarding the information given to you following your procedure. If we do not reach you, we will leave a message.     If any biopsies were taken you will be contacted by phone or by letter within the next 1-3 weeks.  Please call us at [phone] if you have not heard about the biopsies in 3 weeks.    SIGNATURES/CONFIDENTIALITY: You and/or your care partner have signed paperwork which will be entered into your electronic medical record.  These signatures attest to the fact that that the information above on your After Visit Summary has been reviewed and is understood.  Full responsibility of the confidentiality of this discharge information lies with you and/or your care-partner.

## 2023-06-07 NOTE — Op Note (Signed)
 [facility] Patient Name: Ellen Nelson Procedure Date: 06/07/2023 2:31 PM MRN: [id number] Endoscopist: Norleen SAILOR. Abran , MD, [id number] Age: 68 Referring MD:  Date of Birth: 02/03/1956 Gender: Female Account #: [id number] Procedure:                Colonoscopy with cold snare polypectomy x 1 Indications:              Screening for colorectal malignant neoplasm. Father                            with colon cancer late 75s. Reports previous                            examination 15 years ago was unremarkable Medicines:                Monitored Anesthesia Care Procedure:                Pre-Anesthesia Assessment:                           - Prior to the procedure, a History and Physical                            was performed, and patient medications and                            allergies were reviewed. The patient's tolerance of                            previous anesthesia was also reviewed. The risks                            and benefits of the procedure and the sedation                            options and risks were discussed with the patient.                            All questions were answered, and informed consent                            was obtained. Prior Anticoagulants: The patient has                            taken no anticoagulant or antiplatelet agents. ASA                            Grade Assessment: II - A patient with mild systemic                            disease. After reviewing the risks and benefits,                            the patient was deemed in satisfactory condition to  undergo the procedure.                           After obtaining informed consent, the colonoscope                            was passed under direct vision. Throughout the                            procedure, the patient's blood pressure, pulse, and                            oxygen saturations were monitored continuously. The                             CF [id number] #[id number] was introduced through the anus                            and advanced to the the cecum, identified by                            appendiceal orifice and ileocecal valve. The                            ileocecal valve, appendiceal orifice, and rectum                            were photographed. The quality of the bowel                            preparation was excellent. The colonoscopy was                            performed without difficulty. The patient tolerated                            the procedure well. The bowel preparation used was                            SUPREP via split dose instruction. Scope In: 2:44:42 PM Scope Out: 2:57:57 PM Scope Withdrawal Time: 0 hours 11 minutes 11 seconds  Total Procedure Duration: 0 hours 13 minutes 15 seconds  Findings:                 A 3 mm polyp was found in the ascending colon. The                            polyp was sessile. The polyp was removed with a                            cold snare. Resection and retrieval were complete.                           Many diverticula were found in the entire colon.  The exam was otherwise without abnormality on                            direct and retroflexion views. Complications:            No immediate complications. Estimated blood loss:                            None. Estimated Blood Loss:     Estimated blood loss: none. Impression:               - One 3 mm polyp in the ascending colon, removed                            with a cold snare. Resected and retrieved.                           - Diverticulosis in the entire examined colon.                           - The examination was otherwise normal on direct                            and retroflexion views. Recommendation:           - Repeat colonoscopy in 7-10 years for surveillance.                           - Patient has a contact number available for                             emergencies. The signs and symptoms of potential                            delayed complications were discussed with the                            patient. Return to normal activities tomorrow.                            Written discharge instructions were provided to the                            patient.                           - Resume previous diet.                           - Continue present medications.                           - Await pathology results. Norleen SAILOR. Abran, MD 06/07/2023 3:06:23 PM This report has been signed electronically.

## 2023-06-07 NOTE — Progress Notes (Signed)
 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  Patient tells me that she underwent screening colonoscopy at least 15 years ago.  Reports an unremarkable exam.  No polyps.  There is a family history of colon cancer in her father in his late 59s.  Patient herself has a negative GI review of systems except for occasional incontinence with loose stools which has improved when she increase his dietary fiber.  She reports having had recent blood work with her PCP which was unremarkable.   REVIEW OF SYSTEMS:   All non-GI ROS negative except for sinus and allergy trouble, arthritis, ankle swelling, voice change       Past Medical History:  Diagnosis Date   Allergy     Arthritis     Asthma     Hypertension     Mitral prolapse      Per patient   Rotator cuff tear      Right shoulder               Past Surgical History:  Procedure Laterality Date   ABDOMINAL HYSTERECTOMY       RETAINED PLACENTA REMOVAL        32 years ago per patient   TONSILLECTOMY       TOTAL HIP ARTHROPLASTY Right 02/15/2022    Procedure: RIGHT TOTAL HIP ARTHROPLASTY ANTERIOR APPROACH;  Surgeon: Jerri Kay HERO, MD;  Location: [facility] OR;  Service: Orthopedics;  Laterality: Right;  3-C          Social History PAHOUA SCHREINER  reports that she quit smoking about 16 years ago. Her smoking use included cigarettes. She has never used smokeless tobacco. She reports current alcohol use. She reports that she does not use drugs.   family history includes Breast cancer in her sister; Cancer - Colon in her father; Dementia in her father; Early death in her brother; Heart disease in her brother and mother; Hiatal hernia in her mother; Hypertension in her father and mother; Osteoarthritis in her sister.   Allergies       Allergies  Allergen Reactions   Percocet  [Oxycodone-Acetaminophen ] Other (See Comments)      Patient states Percocet caused severe hallucinations.   Sulfa Antibiotics Hives            PHYSICAL EXAMINATION: Vital signs: BP 118/70   Pulse 98   Ht 5' 7 (1.702 m)   Wt 222 lb (100.7 kg)   BMI 34.77 kg/m   Constitutional: generally well-appearing, no acute distress Psychiatric: alert and oriented x3, cooperative Eyes: extraocular movements intact, anicteric, conjunctiva pink Mouth: oral pharynx moist, no lesions Neck: supple no lymphadenopathy Cardiovascular: heart regular rate and rhythm, no murmur Lungs: clear to auscultation bilaterally Abdomen: soft, nontender, nondistended, no obvious ascites, no peritoneal signs, normal bowel sounds, no organomegaly Rectal: Deferred until colonoscopy Extremities: no clubbing or cyanosis.  Trace lower extremity edema bilaterally Skin: no lesions on visible extremities Neuro: No focal deficits.  Cranial nerves intact   ASSESSMENT:   1.  Family history of colon cancer in her father (4s).  Overdue for high risk screening colonoscopy. 2.  Fecal incontinence with loose stools.  Improved with increased dietary fiber 3.  Patient requests a work note for her procedure.     PLAN:   1.  Colonoscopy.The nature of the procedure, as well  as the risks, benefits, and alternatives were carefully and thoroughly reviewed with the patient. Ample time for discussion and questions allowed. The patient understood, was satisfied, and agreed to proceed. 2.  Can supplement with Citrucel fiber 3.  Work note provided

## 2023-06-08 ENCOUNTER — Telehealth: Payer: Self-pay

## 2023-06-08 NOTE — Telephone Encounter (Signed)
 Left message on answering machine.

## 2023-06-09 ENCOUNTER — Ambulatory Visit: Payer: Medicare HMO | Admitting: Podiatry

## 2023-06-09 ENCOUNTER — Encounter: Payer: Self-pay | Admitting: Podiatry

## 2023-06-09 VITALS — Ht 67.0 in | Wt 222.0 lb

## 2023-06-09 DIAGNOSIS — B351 Tinea unguium: Secondary | ICD-10-CM | POA: Diagnosis not present

## 2023-06-09 MED ORDER — FLUCONAZOLE 150 MG PO TABS: 150.0000 mg | ORAL_TABLET | ORAL | 0 refills | Status: DC

## 2023-06-09 NOTE — Progress Notes (Signed)
  Subjective:  Patient ID: Solon Palm, female    DOB: [birth year],  MRN: [id number]  Chief Complaint  Patient presents with   Nail Problem    " I have been treated in the past with a paste and brush and pills for the fungus but its not better"    68 y.o. female presents with the above complaint. History confirmed with patient.  She took the Lamisil she did stop taking it after several doses because it was making her sick  Objective:  Physical Exam: warm, good capillary refill, no trophic changes or ulcerative lesions, normal DP and PT pulses, normal sensory exam, and bilateral hallux nail onychomycosis involving 40% of nail plate.  Diffuse nonpitting nontender edema no evidence of DVT bilateral lower extremities        Assessment:   1. Onychomycosis       Plan:  Patient was evaluated and treated and all questions answered.  Was not able to tolerate Lamisil.  I recommend we switch to pulsed dosing Diflucan we discussed its risks hopefully should be better tolerated with the pulsed dosing and weekly dosing.  Return to see me in 6 months for follow-up  Return in about 6 months (around 12/07/2023) for follow up after nail fungus treatment.

## 2023-06-10 ENCOUNTER — Encounter: Payer: Self-pay | Admitting: Internal Medicine

## 2023-06-10 LAB — SURGICAL PATHOLOGY

## 2023-06-13 DIAGNOSIS — J301 Allergic rhinitis due to pollen: Secondary | ICD-10-CM | POA: Diagnosis not present

## 2023-06-13 DIAGNOSIS — E782 Mixed hyperlipidemia: Secondary | ICD-10-CM | POA: Diagnosis not present

## 2023-06-13 DIAGNOSIS — I10 Essential (primary) hypertension: Secondary | ICD-10-CM | POA: Diagnosis not present

## 2023-06-13 DIAGNOSIS — I2510 Atherosclerotic heart disease of native coronary artery without angina pectoris: Secondary | ICD-10-CM | POA: Diagnosis not present

## 2023-06-29 ENCOUNTER — Ambulatory Visit
Admission: EM | Admit: 2023-06-29 | Discharge: 2023-06-29 | Disposition: A | Discharge status: Home / Self Care | Payer: Medicare HMO | Attending: Family Medicine | Admitting: Family Medicine

## 2023-06-29 DIAGNOSIS — J018 Other acute sinusitis: Secondary | ICD-10-CM | POA: Diagnosis not present

## 2023-06-29 DIAGNOSIS — J040 Acute laryngitis: Secondary | ICD-10-CM | POA: Diagnosis not present

## 2023-06-29 DIAGNOSIS — J4530 Mild persistent asthma, uncomplicated: Secondary | ICD-10-CM

## 2023-06-29 DIAGNOSIS — J309 Allergic rhinitis, unspecified: Secondary | ICD-10-CM

## 2023-06-29 MED ORDER — PROMETHAZINE-DM 6.25-15 MG/5ML PO SYRP: 5.0000 mL | ORAL_SOLUTION | Freq: Three times a day (TID) | ORAL | 0 refills | Status: DC | PRN

## 2023-06-29 MED ORDER — AMOXICILLIN-POT CLAVULANATE 875-125 MG PO TABS: 1.0000 | ORAL_TABLET | Freq: Two times a day (BID) | ORAL | 0 refills | Status: DC

## 2023-06-29 MED ORDER — PREDNISONE 20 MG PO TABS: ORAL_TABLET | ORAL | 0 refills | Status: DC

## 2023-06-29 NOTE — ED Triage Notes (Signed)
Pt reports headache and sore throat x 1 week. States she was with her granddaughter 1 week ago and granddaughter had pneumonia and flu.

## 2023-06-29 NOTE — ED Provider Notes (Signed)
 [name]  Note:  This document was prepared using Conservation officer, historic buildings and may include unintentional dictation errors.  MRN: [id number] DOB: [birth year]  Subjective:   Ellen Nelson is a 68 y.o. female presenting for 10-day history of acute onset persistent sinus congestion, sinus drainage, worsening symptoms including hoarseness, loss of her voice, sinus headaches.  Has had bilateral ear fullness.  No shortness of breath, wheezing, chest congestion.  Has had exposure to influenza and pneumonia.  Has a history of asthma.  Does not need a refill of her inhaler.  Has perennial allergic rhinitis and takes Allegra every day.  No diabetes.  No smoking of any kind including cigarettes, cigars, vaping, marijuana use.    No current facility-administered medications for this encounter.  Current Outpatient Medications:    albuterol  (PROVENTIL ) (2.5 MG/3ML) 0.083% nebulizer solution, Take 2.5 mg by nebulization every 6 (six) hours as needed for wheezing or shortness of breath., Disp: , Rfl:    albuterol  (VENTOLIN  HFA) 108 (90 Base) MCG/ACT inhaler, Inhale 1-2 puffs into the lungs every 6 (six) hours as needed for wheezing or shortness of breath., Disp: , Rfl:    cholecalciferol (VITAMIN D ) 1000 units tablet, Take 2,000 Units by mouth daily. , Disp: , Rfl:    ciclopirox  (PENLAC ) 8 % solution, Apply topically at bedtime. Apply over nail and surrounding skin. Apply daily over previous coat. After seven (7) days, may remove with alcohol and continue cycle. (Patient taking differently: Apply 1 Application topically at bedtime as needed (toenail fungus). Apply over nail and surrounding skin. Apply daily over previous coat. After seven (7) days, may remove with alcohol and continue cycle.), Disp: 6.6 mL, Rfl: 0   fexofenadine (ALLEGRA) 60 MG tablet, Take 60 mg by mouth daily. , Disp: , Rfl:    fluconazole  (DIFLUCAN ) 150 MG tablet, Take 1 tablet (150 mg total) by mouth once  a week for 26 doses., Disp: 26 tablet, Rfl: 0   fluticasone  (FLONASE ) 50 MCG/ACT nasal spray, Place 2 sprays into both nostrils daily., Disp: 16 g, Rfl: 2   losartan  (COZAAR ) 50 MG tablet, Take 50 mg by mouth daily., Disp: , Rfl:    Multiple Vitamins-Minerals (HAIR SKIN AND NAILS FORMULA PO), Take 1 tablet by mouth daily., Disp: , Rfl:    Polyethyl Glycol-Propyl Glycol (SYSTANE OP), Place 1 drop into both eyes in the morning and at bedtime., Disp: , Rfl:    vitamin B-12 (CYANOCOBALAMIN) 100 MCG tablet, Take 100 mcg by mouth daily., Disp: , Rfl:    Allergies  Allergen Reactions   Percocet [Oxycodone-Acetaminophen ] Other (See Comments)    Patient states Percocet caused severe hallucinations.   Sulfa Antibiotics Hives    Past Medical History:  Diagnosis Date   Allergy    Arthritis    Asthma    Hypertension    Mitral prolapse    Per patient   Rotator cuff tear    Right shoulder     Past Surgical History:  Procedure Laterality Date   ABDOMINAL HYSTERECTOMY     COLONOSCOPY     RETAINED PLACENTA REMOVAL     32 years ago per patient   TONSILLECTOMY     TOTAL HIP ARTHROPLASTY Right 02/15/2022   Procedure: RIGHT TOTAL HIP ARTHROPLASTY ANTERIOR APPROACH;  Surgeon: Jerri Kay HERO, MD;  Location: [facility] OR;  Service: Orthopedics;  Laterality: Right;  3-C    Family History  Problem Relation Age of Onset   Colon polyps Mother  Hypertension Mother    Hiatal hernia Mother    Heart disease Mother    Colon polyps Father    Colon cancer Father    Dementia Father    Hypertension Father    Cancer - Colon Father    Osteoarthritis Sister    Breast cancer Sister    Heart disease Brother    Early death Brother    Esophageal cancer Neg Hx    Stomach cancer Neg Hx    Rectal cancer Neg Hx     Social History   Tobacco Use   Smoking status: Former    Current packs/day: 0.00    Types: Cigarettes    Quit date: 2008    Years since quitting: 17.1   Smokeless tobacco: Never  Vaping Use    Vaping status: Never Used  Substance Use Topics   Alcohol use: Yes    Comment: seldom   Drug use: Never    ROS   Objective:   Vitals: BP (!) 151/78 (BP Location: Left Arm)   Pulse 74   Temp 98 F (36.7 C) (Oral)   Resp 18   SpO2 93%   Physical Exam Constitutional:      General: She is not in acute distress.    Appearance: Normal appearance. She is well-developed and normal weight. She is not ill-appearing, toxic-appearing or diaphoretic.  HENT:     Head: Normocephalic and atraumatic.     Right Ear: Tympanic membrane, ear canal and external ear normal. No drainage or tenderness. No middle ear effusion. There is no impacted cerumen. Tympanic membrane is not erythematous or bulging.     Left Ear: Tympanic membrane, ear canal and external ear normal. No drainage or tenderness.  No middle ear effusion. There is no impacted cerumen. Tympanic membrane is not erythematous or bulging.     Nose: Congestion and rhinorrhea present.     Mouth/Throat:     Mouth: Mucous membranes are moist. No oral lesions.     Pharynx: Posterior oropharyngeal erythema (with significant postnasal drainage overlying pharynx, hoarseness noted) present. No pharyngeal swelling, oropharyngeal exudate or uvula swelling.     Tonsils: No tonsillar exudate or tonsillar abscesses.  Eyes:     General: No scleral icterus.       Right eye: No discharge.        Left eye: No discharge.     Extraocular Movements: Extraocular movements intact.     Right eye: Normal extraocular motion.     Left eye: Normal extraocular motion.     Conjunctiva/sclera: Conjunctivae normal.  Cardiovascular:     Rate and Rhythm: Normal rate and regular rhythm.     Heart sounds: Normal heart sounds. No murmur heard.    No friction rub. No gallop.  Pulmonary:     Effort: Pulmonary effort is normal. No respiratory distress.     Breath sounds: No stridor. No wheezing, rhonchi or rales.  Chest:     Chest wall: No tenderness.  Musculoskeletal:      Cervical back: Normal range of motion and neck supple.  Lymphadenopathy:     Cervical: No cervical adenopathy.  Skin:    General: Skin is warm and dry.  Neurological:     General: No focal deficit present.     Mental Status: She is alert and oriented to person, place, and time.     Cranial Nerves: No cranial nerve deficit.     Motor: No weakness.     Coordination: Coordination normal.  Gait: Gait normal.     Comments: No facial asymmetry, negative Romberg and pronator drift.  Psychiatric:        Mood and Affect: Mood normal.        Behavior: Behavior normal.        Thought Content: Thought content normal.        Judgment: Judgment normal.    Assessment and Plan :   PDMP not reviewed this encounter.  1. Acute non-recurrent sinusitis of other sinus   2. Allergic rhinitis, unspecified seasonality, unspecified trigger   3. Mild persistent asthma, uncomplicated   4. Laryngitis    Deferred imaging given clear cardiopulmonary exam, hemodynamically stable vital signs.  Will start empiric treatment for sinusitis with Augmentin .  In the context of her perennial allergic rhinitis, asthma recommended a oral prednisone  course.  Recommended supportive care otherwise. Counseled patient on potential for adverse effects with medications prescribed/recommended today, ER and return-to-clinic precautions discussed, patient verbalized understanding.    Christopher Savannah, [place] 06/29/23 [id number]

## 2023-06-29 NOTE — Discharge Instructions (Addendum)
 We will manage this as a sinus infection with Augmentin . For sore throat or cough try using a honey-based tea. Use 3 teaspoons of honey with juice squeezed from half lemon. Place shaved pieces of ginger into 1/2-1 cup of water and warm over stove top. Then mix the ingredients and repeat every 4 hours as needed. Please take Tylenol  500mg -650mg  every 6 hours for throat pain, fevers, aches and pains. Hydrate very well with at least 2 liters of water. Eat light meals such as soups (chicken and noodles, vegetable, chicken and wild rice).  Do not eat foods that you are allergic to.  Taking an antihistamine like Zyrtec can help against postnasal drainage, sinus congestion which can cause sinus pain, sinus headaches, throat pain, painful swallowing, coughing.  You can take this together with prednisone  for sinus inflammation and laryngitis.  Use cough medication as needed.

## 2023-08-03 ENCOUNTER — Encounter: Payer: Self-pay | Admitting: Orthopaedic Surgery

## 2023-08-03 ENCOUNTER — Ambulatory Visit: Admitting: Orthopaedic Surgery

## 2023-08-03 ENCOUNTER — Telehealth: Payer: Self-pay | Admitting: Orthopaedic Surgery

## 2023-08-03 DIAGNOSIS — G5601 Carpal tunnel syndrome, right upper limb: Secondary | ICD-10-CM

## 2023-08-03 DIAGNOSIS — G5602 Carpal tunnel syndrome, left upper limb: Secondary | ICD-10-CM | POA: Insufficient documentation

## 2023-08-03 DIAGNOSIS — G5603 Carpal tunnel syndrome, bilateral upper limbs: Secondary | ICD-10-CM | POA: Diagnosis not present

## 2023-08-03 MED ORDER — GABAPENTIN 100 MG PO CAPS: 100.0000 mg | ORAL_CAPSULE | Freq: Every day | ORAL | 3 refills | Status: DC

## 2023-08-03 NOTE — Progress Notes (Signed)
 Office Visit Note   Patient: Ellen Nelson           Date of Birth: Apr 16, 1956           MRN: [id number] Visit Date: 08/03/2023              Requested by: Rinaldo Cloud, MD [phone] [address] [place],  [place] [address] PCP: Rinaldo Cloud, MD   Assessment & Plan: Visit Diagnoses:  1. Left carpal tunnel syndrome   2. Right carpal tunnel syndrome     Plan: Impression is bilateral carpal tunnel syndrome.  Nerve conduction studies showed moderate disease in 2021.  Her symptoms have gotten worse.  Cortisone injections and braces only provide temporary relief.  At this point she has elected to move forward with a left carpal tunnel release.  Eunice Blase will call her to confirm surgery date.  Risk benefits prognosis reviewed.  Follow-Up Instructions: No follow-ups on file.   Orders:  No orders of the defined types were placed in this encounter.  Meds ordered this encounter  Medications   gabapentin (NEURONTIN) 100 MG capsule    Sig: Take 1-3 capsules (100-300 mg total) by mouth at bedtime.    Dispense:  30 capsule    Refill:  3      Procedures: No procedures performed   Clinical Data: No additional findings.   Subjective: Chief Complaint  Patient presents with   Right Hand - Pain   Left Hand - Pain    HPI Ellen Nelson comes in today for worsening symptoms of bilateral carpal tunnel syndrome.  She had nerve conduction studies in 2021 which showed moderate bilateral carpal tunnel syndrome.  She did not get much relief from injections.  The braces do not help anymore.  This is preventing her from sleeping.  Her left hand is worse. Review of Systems  Constitutional: Negative.   HENT: Negative.    Eyes: Negative.   Respiratory: Negative.    Cardiovascular: Negative.   Endocrine: Negative.   Musculoskeletal: Negative.   Neurological: Negative.   Hematological: Negative.   Psychiatric/Behavioral: Negative.    All other systems reviewed and are  negative.    Objective: Vital Signs: There were no vitals taken for this visit.  Physical Exam Vitals and nursing note reviewed.  Constitutional:      Appearance: She is well-developed.  HENT:     Head: Normocephalic and atraumatic.  Pulmonary:     Effort: Pulmonary effort is normal.  Abdominal:     Palpations: Abdomen is soft.  Musculoskeletal:     Cervical back: Neck supple.  Skin:    General: Skin is warm.     Capillary Refill: Capillary refill takes less than 2 seconds.  Neurological:     Mental Status: She is alert and oriented to person, place, and time.  Psychiatric:        Behavior: Behavior normal.        Thought Content: Thought content normal.        Judgment: Judgment normal.     Ortho Exam Examination of bilateral hands are unchanged from prior visit. Specialty Comments:  No specialty comments available.  Imaging: No results found.   PMFS History: Patient Active Problem List   Diagnosis Date Noted   Right carpal tunnel syndrome 08/03/2023   Left carpal tunnel syndrome 08/03/2023   Status post total replacement of right hip 02/15/2022   Primary osteoarthritis of right hip 01/12/2022   Bilateral hand pain 11/21/2020   Positive  ANA (antinuclear antibody) 11/21/2020   Nonspecific syndrome suggestive of viral illness 05/31/2020   Biceps tendinopathy of right upper extremity 12/04/2019   Complete tear of right rotator cuff 09/05/2019   Impingement syndrome of right shoulder 09/05/2019   Past Medical History:  Diagnosis Date   Allergy    Arthritis    Asthma    Hypertension    Mitral prolapse    Per patient   Rotator cuff tear    Right shoulder    Family History  Problem Relation Age of Onset   Colon polyps Mother    Hypertension Mother    Hiatal hernia Mother    Heart disease Mother    Colon polyps Father    Colon cancer Father    Dementia Father    Hypertension Father    Cancer - Colon Father    Osteoarthritis Sister    Breast cancer  Sister    Heart disease Brother    Early death Brother    Esophageal cancer Neg Hx    Stomach cancer Neg Hx    Rectal cancer Neg Hx     Past Surgical History:  Procedure Laterality Date   ABDOMINAL HYSTERECTOMY     COLONOSCOPY     RETAINED PLACENTA REMOVAL     32 years ago per patient   TONSILLECTOMY     TOTAL HIP ARTHROPLASTY Right 02/15/2022   Procedure: RIGHT TOTAL HIP ARTHROPLASTY ANTERIOR APPROACH;  Surgeon: Tarry Kos, MD;  Location: [facility] OR;  Service: Orthopedics;  Laterality: Right;  3-C   Social History   Occupational History   Occupation: waitress  Tobacco Use   Smoking status: Former    Current packs/day: 0.00    Types: Cigarettes    Quit date: 2008    Years since quitting: 17.2   Smokeless tobacco: Never  Vaping Use   Vaping status: Never Used  Substance and Sexual Activity   Alcohol use: Yes    Comment: seldom   Drug use: Never   Sexual activity: Not Currently    Birth control/protection: Post-menopausal

## 2023-08-03 NOTE — Telephone Encounter (Signed)
 Left message on patient's voicemail requesting she call back to schedule left carpal tunnel release with Dr Roda Shutters.  Name and direct number were provided.

## 2023-08-23 ENCOUNTER — Telehealth: Payer: Self-pay | Admitting: Orthopaedic Surgery

## 2023-08-23 NOTE — Telephone Encounter (Signed)
 Called and scheduled nurse visit for tomorrow morning.

## 2023-08-23 NOTE — Telephone Encounter (Signed)
 Called patient to discuss surgery dates for left carpal tunnel release.  Patient states she is unable to schedule anything in the near future due to workload and needing people to cover while she is out.  She would like to speak with someone about getting splints or braces that can tie her over until she can have surgery.

## 2023-08-23 NOTE — Telephone Encounter (Signed)
 Yes - thank you

## 2023-08-24 ENCOUNTER — Ambulatory Visit

## 2023-08-24 DIAGNOSIS — G5602 Carpal tunnel syndrome, left upper limb: Secondary | ICD-10-CM | POA: Diagnosis not present

## 2023-08-24 DIAGNOSIS — G5601 Carpal tunnel syndrome, right upper limb: Secondary | ICD-10-CM | POA: Diagnosis not present

## 2023-08-24 NOTE — Progress Notes (Unsigned)
 Patient wan in today for her Prolia injection.  Patient tolerated the injection well.  Had him to sit for 5 minutes before releasing him.  Advised him to ice shoulder if it became painful and also advised him that he may have some body aches due to the injection

## 2023-09-07 DIAGNOSIS — I2510 Atherosclerotic heart disease of native coronary artery without angina pectoris: Secondary | ICD-10-CM | POA: Diagnosis not present

## 2023-09-07 DIAGNOSIS — I10 Essential (primary) hypertension: Secondary | ICD-10-CM | POA: Diagnosis not present

## 2023-09-07 DIAGNOSIS — E782 Mixed hyperlipidemia: Secondary | ICD-10-CM | POA: Diagnosis not present

## 2023-09-07 DIAGNOSIS — M150 Primary generalized (osteo)arthritis: Secondary | ICD-10-CM | POA: Diagnosis not present

## 2023-09-08 ENCOUNTER — Ambulatory Visit: Admission: EM | Admit: 2023-09-08 | Discharge: 2023-09-08 | Disposition: A | Discharge status: Home / Self Care

## 2023-09-08 ENCOUNTER — Encounter: Payer: Self-pay | Admitting: Emergency Medicine

## 2023-09-08 DIAGNOSIS — J4521 Mild intermittent asthma with (acute) exacerbation: Secondary | ICD-10-CM

## 2023-09-08 MED ORDER — ALBUTEROL SULFATE HFA 108 (90 BASE) MCG/ACT IN AERS: 1.0000 | INHALATION_SPRAY | Freq: Four times a day (QID) | RESPIRATORY_TRACT | 0 refills | Status: AC | PRN

## 2023-09-08 MED ORDER — IPRATROPIUM-ALBUTEROL 0.5-2.5 (3) MG/3ML IN SOLN
3.0000 mL | Freq: Once | RESPIRATORY_TRACT | Status: AC
  Administered 2023-09-08: 3 mL via RESPIRATORY_TRACT

## 2023-09-08 MED ORDER — PREDNISONE 20 MG PO TABS: 40.0000 mg | ORAL_TABLET | Freq: Every day | ORAL | 0 refills | Status: AC

## 2023-09-08 NOTE — ED Triage Notes (Signed)
 Pt reports SOB and cough x5 days. Reports her asthma has not bothered her in yrs. Has seen her MD who recommended her allergy specialist but cannot get in to see them until Monday. She also states they told her not to take any meds 5 days prior, but pt reports she can barely breathe. No inhaler at home. Has neb txts at home, but only did it this past Sunday

## 2023-09-08 NOTE — ED Provider Notes (Signed)
 [facility]    CSN: [id number] Arrival date & time: 09/08/23  0904      History   Chief Complaint Chief Complaint  Patient presents with   Shortness of Breath   Cough    HPI Ellen Nelson is a 68 y.o. female.   Patient here today for evaluation of cough and shortness of breath that started 5 days ago.  She has had some congestion and was advised by her primary care doctor to follow-up with her allergist.  She is unable to visit them until Monday however and states that they had recommended that she not take any medication for 5 days.  She reports she is struggling with this though because she is having trouble breathing.  She does not have an inhaler at home but does have nebulizer treatments.  She states that she has not had a nebulizer treatment over the last 3 days.  She denies fever.  The history is provided by the patient.  Shortness of Breath Associated symptoms: cough   Associated symptoms: no abdominal pain, no ear pain, no fever, no sore throat, no vomiting and no wheezing   Cough Associated symptoms: shortness of breath   Associated symptoms: no chills, no ear pain, no eye discharge, no fever, no sore throat and no wheezing     Past Medical History:  Diagnosis Date   Allergy    Arthritis    Asthma    Hypertension    Mitral prolapse    Per patient   Rotator cuff tear    Right shoulder    Patient Active Problem List   Diagnosis Date Noted   Right carpal tunnel syndrome 08/03/2023   Left carpal tunnel syndrome 08/03/2023   Status post total replacement of right hip 02/15/2022   Primary osteoarthritis of right hip 01/12/2022   Bilateral hand pain 11/21/2020   Positive ANA (antinuclear antibody) 11/21/2020   Nonspecific syndrome suggestive of viral illness 05/31/2020   Biceps tendinopathy of right upper extremity 12/04/2019   Complete tear of right rotator cuff 09/05/2019   Impingement syndrome of right shoulder 09/05/2019    Past Surgical  History:  Procedure Laterality Date   ABDOMINAL HYSTERECTOMY     COLONOSCOPY     RETAINED PLACENTA REMOVAL     32 years ago per patient   TONSILLECTOMY     TOTAL HIP ARTHROPLASTY Right 02/15/2022   Procedure: RIGHT TOTAL HIP ARTHROPLASTY ANTERIOR APPROACH;  Surgeon: Tarry Kos, MD;  Location: [facility] OR;  Service: Orthopedics;  Laterality: Right;  3-C    OB History   No obstetric history on file.      Home Medications    Prior to Admission medications   Medication Sig Start Date End Date Taking? Authorizing Provider  albuterol (PROVENTIL) (2.5 MG/3ML) 0.083% nebulizer solution Take 2.5 mg by nebulization every 6 (six) hours as needed for wheezing or shortness of breath.   Yes [provider]  albuterol (VENTOLIN HFA) 108 (90 Base) MCG/ACT inhaler Inhale 1-2 puffs into the lungs every 6 (six) hours as needed for wheezing or shortness of breath. 09/08/23  Yes Tomi Bamberger, PA-C  cholecalciferol (VITAMIN D) 1000 units tablet Take 2,000 Units by mouth daily.    Yes [provider]  fexofenadine (ALLEGRA) 60 MG tablet Take 60 mg by mouth daily.    Yes [provider]  fluticasone (FLONASE) 50 MCG/ACT nasal spray Place 2 sprays into both nostrils daily. 05/15/18  Yes Eustace Moore, MD  losartan (COZAAR) 50 MG tablet Take 50 mg by mouth daily. 06/04/23  Yes [provider]  Multiple Vitamins-Minerals (HAIR SKIN AND NAILS FORMULA PO) Take 1 tablet by mouth daily.   Yes [provider]  Polyethyl Glycol-Propyl Glycol (SYSTANE OP) Place 1 drop into both eyes in the morning and at bedtime.   Yes [provider]  predniSONE (DELTASONE) 20 MG tablet Take 2 tablets (40 mg total) by mouth daily with breakfast for 5 days. 09/08/23 09/13/23 Yes Vernestine Gondola, PA-C  amoxicillin (AMOXIL) 500 MG capsule Take 500 mg by mouth every 8 (eight) hours. Patient not taking: Reported on 09/08/2023 07/14/23   [provider]   amoxicillin-clavulanate (AUGMENTIN) 875-125 MG tablet Take 1 tablet by mouth 2 (two) times daily. Patient not taking: Reported on 09/08/2023 06/29/23   Adolph Hoop, PA-C  ciclopirox (PENLAC) 8 % solution Apply topically at bedtime. Apply over nail and surrounding skin. Apply daily over previous coat. After seven (7) days, may remove with alcohol and continue cycle. Patient taking differently: Apply 1 Application topically at bedtime as needed (toenail fungus). Apply over nail and surrounding skin. Apply daily over previous coat. After seven (7) days, may remove with alcohol and continue cycle. 10/15/21   McDonald, Olive Better, DPM  fluconazole (DIFLUCAN) 150 MG tablet Take 1 tablet (150 mg total) by mouth once a week for 26 doses. Patient not taking: Reported on 09/08/2023 06/09/23 12/02/23  Floyce Hutching, DPM  gabapentin (NEURONTIN) 100 MG capsule Take 1-3 capsules (100-300 mg total) by mouth at bedtime. Patient not taking: Reported on 09/08/2023 08/03/23   Wes Hamman, MD  promethazine-dextromethorphan (PROMETHAZINE-DM) 6.25-15 MG/5ML syrup Take 5 mLs by mouth 3 (three) times daily as needed for cough. Patient not taking: Reported on 09/08/2023 06/29/23   Adolph Hoop, PA-C  vitamin B-12 (CYANOCOBALAMIN) 100 MCG tablet Take 100 mcg by mouth daily. Patient not taking: Reported on 09/08/2023    [provider]    Family History Family History  Problem Relation Age of Onset   Colon polyps Mother    Hypertension Mother    Hiatal hernia Mother    Heart disease Mother    Colon polyps Father    Colon cancer Father    Dementia Father    Hypertension Father    Cancer - Colon Father    Osteoarthritis Sister    Breast cancer Sister    Heart disease Brother    Early death Brother    Esophageal cancer Neg Hx    Stomach cancer Neg Hx    Rectal cancer Neg Hx     Social History Social History   Tobacco Use   Smoking status: Former    Current packs/day: 0.00    Types: Cigarettes    Quit date:  2008    Years since quitting: 17.3   Smokeless tobacco: Never  Vaping Use   Vaping status: Never Used  Substance Use Topics   Alcohol use: Yes    Comment: seldom   Drug use: Never     Allergies   Percocet [oxycodone-acetaminophen] and Sulfa antibiotics   Review of Systems Review of Systems  Constitutional:  Negative for chills and fever.  HENT:  Positive for congestion. Negative for ear pain and sore throat.   Eyes:  Negative for discharge and redness.  Respiratory:  Positive for cough and shortness of breath. Negative for wheezing.   Gastrointestinal:  Negative for abdominal pain, diarrhea, nausea and vomiting.     Physical Exam  Triage Vital Signs ED Triage Vitals  Encounter Vitals Group     BP 09/08/23 0915 (!) 145/89     Systolic BP Percentile --      Diastolic BP Percentile --      Pulse Rate 09/08/23 0915 (!) 105     Resp 09/08/23 0915 (!) 24     Temp 09/08/23 0915 98.5 F (36.9 C)     Temp Source 09/08/23 0915 Oral     SpO2 09/08/23 0915 95 %     Weight --      Height --      Head Circumference --      Peak Flow --      Pain Score 09/08/23 0917 6     Pain Loc --      Pain Education --      Exclude from Growth Chart --    No data found.  Updated Vital Signs BP (!) 145/89 (BP Location: Left Arm)   Pulse 89   Temp 98.5 F (36.9 C) (Oral)   Resp 18   SpO2 100% Comment: post-neb txt    Physical Exam Vitals and nursing note reviewed.  Constitutional:      General: She is not in acute distress.    Appearance: Normal appearance. She is not ill-appearing.  HENT:     Head: Normocephalic and atraumatic.     Right Ear: Tympanic membrane normal.     Left Ear: Tympanic membrane normal.     Nose: Congestion present.     Mouth/Throat:     Pharynx: No posterior oropharyngeal erythema.  Eyes:     Conjunctiva/sclera: Conjunctivae normal.  Cardiovascular:     Rate and Rhythm: Normal rate and regular rhythm.     Heart sounds: Normal heart sounds. No murmur  heard. Pulmonary:     Effort: Pulmonary effort is normal. No respiratory distress.     Breath sounds: Wheezing present. No rhonchi or rales.     Comments: Diffuse wheezing throughout- improved after neb treatment Skin:    General: Skin is warm and dry.  Neurological:     Mental Status: She is alert.  Psychiatric:        Mood and Affect: Mood normal.        Thought Content: Thought content normal.      UC Treatments / Results  Labs (all labs ordered are listed, but only abnormal results are displayed) Labs Reviewed - No data to display  EKG   Radiology No results found.  Procedures Procedures (including critical care time)  Medications Ordered in UC Medications  ipratropium-albuterol (DUONEB) 0.5-2.5 (3) MG/3ML nebulizer solution 3 mL (3 mLs Nebulization Given 09/08/23 0931)    Initial Impression / Assessment and Plan / UC Course  I have reviewed the triage vital signs and the nursing notes.  Pertinent labs & imaging results that were available during my care of the patient were reviewed by me and considered in my medical decision making (see chart for details).    Will treat to cover asthma exacerbation with steroid burst and albuterol. Advised her to use her neb treatment at home as needed. Encouraged follow up if no continued improvement or with any further concerns.    Final Clinical Impressions(s) / UC Diagnoses   Final diagnoses:  Mild intermittent asthma with acute exacerbation   Discharge Instructions   None    ED Prescriptions     Medication Sig Dispense Auth. Provider   predniSONE (DELTASONE) 20 MG tablet Take 2 tablets (  40 mg total) by mouth daily with breakfast for 5 days. 10 tablet Jami Mcclintock F, PA-C   albuterol (VENTOLIN HFA) 108 (90 Base) MCG/ACT inhaler Inhale 1-2 puffs into the lungs every 6 (six) hours as needed for wheezing or shortness of breath. 8 g Vernestine Gondola, PA-C      PDMP not reviewed this encounter.   Vernestine Gondola,  PA-C 09/08/23 1042

## 2023-09-26 DIAGNOSIS — E782 Mixed hyperlipidemia: Secondary | ICD-10-CM | POA: Diagnosis not present

## 2023-09-26 DIAGNOSIS — I2510 Atherosclerotic heart disease of native coronary artery without angina pectoris: Secondary | ICD-10-CM | POA: Diagnosis not present

## 2023-09-26 DIAGNOSIS — I10 Essential (primary) hypertension: Secondary | ICD-10-CM | POA: Diagnosis not present

## 2023-09-26 DIAGNOSIS — M150 Primary generalized (osteo)arthritis: Secondary | ICD-10-CM | POA: Diagnosis not present

## 2023-11-04 ENCOUNTER — Ambulatory Visit: Admitting: Internal Medicine

## 2023-11-04 ENCOUNTER — Encounter: Payer: Self-pay | Admitting: Internal Medicine

## 2023-11-04 VITALS — BP 128/74 | HR 74 | Temp 97.4°F | Ht 67.0 in | Wt 220.0 lb

## 2023-11-04 DIAGNOSIS — Z87891 Personal history of nicotine dependence: Secondary | ICD-10-CM

## 2023-11-04 DIAGNOSIS — J4489 Other specified chronic obstructive pulmonary disease: Secondary | ICD-10-CM | POA: Insufficient documentation

## 2023-11-04 MED ORDER — BUDESONIDE-FORMOTEROL FUMARATE 80-4.5 MCG/ACT IN AERO
INHALATION_SPRAY | RESPIRATORY_TRACT | 12 refills | Status: DC
Start: 2023-11-04 — End: 2023-12-06

## 2023-11-04 MED ORDER — BUDESONIDE-FORMOTEROL FUMARATE 80-4.5 MCG/ACT IN AERO
INHALATION_SPRAY | RESPIRATORY_TRACT | 12 refills | Status: AC
Start: 2023-11-04 — End: ?

## 2023-11-04 NOTE — Patient Instructions (Signed)
 Plan A = Automatic = Always=    Symbicort 80 Take 2 puffs first thing in am and then another 2 puffs about 12 hours later.    Work on inhaler technique:  relax and gently blow all the way out then take a nice smooth full deep breath back in, triggering the inhaler at same time you start breathing in.  Hold breath in for at least  5 seconds if you can. Blow out symbicorty  thru nose. Rinse and gargle with water when done.  If mouth or throat bother you at all,  try brushing teeth/gums/tongue with arm and hammer toothpaste/ make a slurry and gargle and spit out.      Plan B = Backup (to supplement plan A, not to replace it) Only use your albuterol  inhaler as a rescue medication to be used if you can't catch your breath by resting or doing a relaxed purse lip breathing pattern.  - The less you use it, the better it will work when you need it. - Ok to use the inhaler up to 2 puffs  every 4 hours if you must but call for appointment if use goes up over your usual need - Don't leave home without it !!  (think of it like the spare tire for your car)   Plan C = Crisis (instead of Plan B but only if Plan B stops working) - only use your albuterol  nebulizer if you first try Plan B and it fails to help > ok to use the nebulizer up to every 4 hours but if start needing it regularly call for immediate appointment   Plan D = Deltasone  (prednisone )    Please schedule a follow up visit in 3 months but call sooner if needed with PFTs

## 2023-11-04 NOTE — Progress Notes (Unsigned)
 Ellen Nelson, female    DOB: [birth year]   MRN: [id number]   Brief patient profile:  78 yobf quit smoking 2008  referred to pulmonary clinic 11/04/2023 by DR Glena Landau  for AB      Developed  rhinitis > [place] rx shots  x 4-5 month  then sob  dx as asthma better with saba   Pt not previously seen by PCCM service.    History of Present Illness  11/04/2023  Pulmonary/ 1st office eval/Jae Bruck  Chief Complaint  Patient presents with   Shortness of Breath    Consult- Pt states there are times when she feels like she can not breathe. Had the flu in February, Sinus infection in March, Asthma flare up in April. On prednisone  each month. Pt states the s.o.b is random, not towards exertion or rest but can come anytime  Dyspnea:  much better but still needing albuterol  @ increased freq  for over a month and 3 cycles of pred since Feb though now hasn't used pred in a about a month  Not limited by breathing from desired activities   Cough: some dry sporadic daytime cough  Sleep: bed flat / cx pillow  SABA use: not x 2 weeks  02 [id number]     No obvious day to day or daytime pattern/variability or assoc excess/ purulent sputum or mucus plugs or hemoptysis or cp or chest tightness, subjective wheeze or overt  hb symptoms.    Also denies any obvious fluctuation of symptoms with weather or environmental changes or other aggravating or alleviating factors except as outlined above   No unusual exposure hx or h/o childhood pna/ asthma or knowledge of premature birth.  Current Allergies, Complete Past Medical History, Past Surgical History, Family History, and Social History were reviewed in [employer] record.  ROS  The following are not active complaints unless bolded Hoarseness, sore throat, dysphagia, dental problems, itching, sneezing,  nasal congestion or discharge of excess mucus or purulent secretions, ear ache,   fever, chills, sweats, unintended wt loss or wt gain,  classically pleuritic or exertional cp,  orthopnea pnd or arm/hand swelling  or leg swelling, presyncope, palpitations, abdominal pain, anorexia, nausea, vomiting, diarrhea  or change in bowel habits or change in bladder habits, change in stools or change in urine, dysuria, hematuria,  rash, arthralgias, visual complaints, headache, numbness, weakness or ataxia or problems with walking or coordination,  change in mood or  memory.             Outpatient Medications Prior to Visit  Medication Sig Dispense Refill   albuterol  (PROVENTIL ) (2.5 MG/3ML) 0.083% nebulizer solution Take 2.5 mg by nebulization every 6 (six) hours as needed for wheezing or shortness of breath.     albuterol  (VENTOLIN  HFA) 108 (90 Base) MCG/ACT inhaler Inhale 1-2 puffs into the lungs every 6 (six) hours as needed for wheezing or shortness of breath. 8 g 0   cholecalciferol (VITAMIN D) 1000 units tablet Take 2,000 Units by mouth daily.      ciclopirox  (PENLAC ) 8 % solution Apply topically at bedtime. Apply over nail and surrounding skin. Apply daily over previous coat. After seven (7) days, may remove with alcohol and continue cycle. (Patient taking differently: Apply 1 Application topically at bedtime as needed (toenail fungus). Apply over nail and surrounding skin. Apply daily over previous coat. After seven (7) days, may remove with alcohol and continue cycle.) 6.6 mL 0   fexofenadine (ALLEGRA) 60 MG tablet Take  60 mg by mouth daily.      fluticasone  (FLONASE ) 50 MCG/ACT nasal spray Place 2 sprays into both nostrils daily. 16 g 2   losartan  (COZAAR ) 50 MG tablet Take 50 mg by mouth daily.     Multiple Vitamins-Minerals (HAIR SKIN AND NAILS FORMULA PO) Take 1 tablet by mouth daily.     Polyethyl Glycol-Propyl Glycol (SYSTANE OP) Place 1 drop into both eyes in the morning and at bedtime.     promethazine -dextromethorphan (PROMETHAZINE -DM) 6.25-15 MG/5ML syrup Take 5 mLs by mouth 3 (three) times daily as needed for cough. 200 mL  0   amoxicillin  (AMOXIL ) 500 MG capsule Take 500 mg by mouth every 8 (eight) hours. (Patient not taking: Reported on 09/08/2023)     amoxicillin -clavulanate (AUGMENTIN ) 875-125 MG tablet Take 1 tablet by mouth 2 (two) times daily. (Patient not taking: Reported on 09/08/2023) 20 tablet 0   fluconazole  (DIFLUCAN ) 150 MG tablet Take 1 tablet (150 mg total) by mouth once a week for 26 doses. (Patient not taking: Reported on 09/08/2023) 26 tablet 0   gabapentin  (NEURONTIN ) 100 MG capsule Take 1-3 capsules (100-300 mg total) by mouth at bedtime. (Patient not taking: Reported on 11/04/2023) 30 capsule 3   vitamin B-12 (CYANOCOBALAMIN) 100 MCG tablet Take 100 mcg by mouth daily. (Patient not taking: Reported on 11/04/2023)     No facility-administered medications prior to visit.    Past Medical History:  Diagnosis Date   Allergy    Arthritis    Asthma    Hypertension    Mitral prolapse    Per patient   Rotator cuff tear    Right shoulder      Objective:     BP 128/74 (BP Location: Left Arm, Patient Position: Sitting, Cuff Size: Normal)   Pulse 74   Temp (!) 97.4 F (36.3 C) (Temporal)   Ht 5' 7 (1.702 m)   Wt 220 lb (99.8 kg)   SpO2 99%   BMI 34.46 kg/m   SpO2: 99 %  Amb mod obese (by BMI) bf easily frustrated when discussing her health something's gotta be done    HEENT : Oropharynx  clear      Nasal turbinates mild non-specific edema.   NECK :  without  apparent JVD/ palpable Nodes/TM    LUNGS: no acc muscle use,  Nl contour chest which is clear to A and P bilaterally without cough on insp or exp maneuvers   CV:  RRR  no s3 or murmur or increase in P2, and no edema   ABD:  soft and nontender   MS:  Gait nl  ext warm without deformities Or obvious joint restrictions  calf tenderness, cyanosis or clubbing    SKIN: warm and dry without lesions    NEURO:  alert, approp, nl sensorium with  no motor or cerebellar deficits apparent.       Assessment   No  problem-specific Assessment & Plan notes found for this encounter.     Vernestine Gondola, MD 11/04/2023

## 2023-11-05 NOTE — Assessment & Plan Note (Signed)
 Onset 2008 while smoking improved some but never completely better  - 11/04/2023  After extensive coaching inhaler device,  effectiveness =    75% with hfa/ start symbicort 80  2bid   Classic steroid responsive AB though could have some underlying copd as well so needs PFT to complete the w/u.   Discussed in detail all the  indications, usual  risks and alternatives  relative to the benefits with patient who agrees to proceed with Rx as outlined.      F/u  3 m with PFTs in meantime / follow ABC action plan in the meantim see avs for instructions unique to this ov         Each maintenance medication was reviewed in detail including emphasizing most importantly the difference between maintenance and prns and under what circumstances the prns are to be triggered using an action plan format where appropriate.  Total time for H and P, chart review, counseling, reviewing hfa  device(s) and generating customized AVS unique to this office visit / same day charting = 36 min

## 2023-11-18 ENCOUNTER — Ambulatory Visit (HOSPITAL_BASED_OUTPATIENT_CLINIC_OR_DEPARTMENT_OTHER): Admitting: Internal Medicine

## 2023-11-18 DIAGNOSIS — J4489 Other specified chronic obstructive pulmonary disease: Secondary | ICD-10-CM | POA: Diagnosis not present

## 2023-11-18 LAB — PULMONARY FUNCTION TEST
DL/VA % pred: 119 %
DL/VA: 4.87 ml/min/mmHg/L
DLCO cor % pred: 96 %
DLCO cor: 20.93 ml/min/mmHg
DLCO unc % pred: 96 %
DLCO unc: 20.93 ml/min/mmHg
FEF 25-75 Post: 2.8 L/s
FEF 25-75 Pre: 1.55 L/s
FEF2575-%Change-Post: 80 %
FEF2575-%Pred-Post: 126 %
FEF2575-%Pred-Pre: 69 %
FEV1-%Change-Post: 15 %
FEV1-%Pred-Post: 80 %
FEV1-%Pred-Pre: 70 %
FEV1-Post: 2.14 L
FEV1-Pre: 1.86 L
FEV1FVC-%Change-Post: -2 %
FEV1FVC-%Pred-Pre: 101 %
FEV6-%Change-Post: 16 %
FEV6-%Pred-Post: 83 %
FEV6-%Pred-Pre: 71 %
FEV6-Post: 2.78 L
FEV6-Pre: 2.39 L
FEV6FVC-%Pred-Post: 104 %
FEV6FVC-%Pred-Pre: 104 %
FVC-%Change-Post: 18 %
FVC-%Pred-Post: 81 %
FVC-%Pred-Pre: 68 %
FVC-Post: 2.84 L
FVC-Pre: 2.39 L
Post FEV1/FVC ratio: 75 %
Post FEV6/FVC ratio: 100 %
Pre FEV1/FVC ratio: 78 %
Pre FEV6/FVC Ratio: 100 %
RV % pred: 154 %
RV: 3.52 L
TLC % pred: 112 %
TLC: 6.21 L

## 2023-11-18 NOTE — Progress Notes (Signed)
 Full PFT performed today.

## 2023-11-18 NOTE — Patient Instructions (Signed)
 Full PFT performed today.

## 2023-11-19 ENCOUNTER — Ambulatory Visit: Payer: Self-pay | Admitting: Internal Medicine

## 2023-11-21 DIAGNOSIS — H2513 Age-related nuclear cataract, bilateral: Secondary | ICD-10-CM | POA: Diagnosis not present

## 2023-11-21 DIAGNOSIS — H04123 Dry eye syndrome of bilateral lacrimal glands: Secondary | ICD-10-CM | POA: Diagnosis not present

## 2023-11-21 NOTE — Progress Notes (Signed)
Spoke with pt and notified of results per Dr. Wert. Pt verbalized understanding and denied any questions. 

## 2023-11-29 NOTE — Progress Notes (Signed)
Called and left detailed msg on machine with results ok per DPR.

## 2023-12-06 ENCOUNTER — Other Ambulatory Visit: Payer: Self-pay | Admitting: Internal Medicine

## 2023-12-08 ENCOUNTER — Ambulatory Visit: Payer: Medicare HMO | Admitting: Podiatry

## 2023-12-09 DIAGNOSIS — J45909 Unspecified asthma, uncomplicated: Secondary | ICD-10-CM | POA: Diagnosis not present

## 2023-12-09 DIAGNOSIS — E782 Mixed hyperlipidemia: Secondary | ICD-10-CM | POA: Diagnosis not present

## 2023-12-09 DIAGNOSIS — I10 Essential (primary) hypertension: Secondary | ICD-10-CM | POA: Diagnosis not present

## 2023-12-09 DIAGNOSIS — I2510 Atherosclerotic heart disease of native coronary artery without angina pectoris: Secondary | ICD-10-CM | POA: Diagnosis not present

## 2023-12-13 ENCOUNTER — Ambulatory Visit: Admitting: Podiatry

## 2023-12-13 VITALS — Ht 67.0 in | Wt 220.0 lb

## 2023-12-13 DIAGNOSIS — D1724 Benign lipomatous neoplasm of skin and subcutaneous tissue of left leg: Secondary | ICD-10-CM | POA: Diagnosis not present

## 2023-12-13 DIAGNOSIS — B351 Tinea unguium: Secondary | ICD-10-CM

## 2023-12-13 DIAGNOSIS — D1723 Benign lipomatous neoplasm of skin and subcutaneous tissue of right leg: Secondary | ICD-10-CM | POA: Diagnosis not present

## 2023-12-13 DIAGNOSIS — M19071 Primary osteoarthritis, right ankle and foot: Secondary | ICD-10-CM | POA: Diagnosis not present

## 2023-12-13 NOTE — Patient Instructions (Signed)
Look for Voltaren gel at the pharmacy over the counter or online (also known as diclofenac 1% gel). Apply to the painful areas 3-4x daily with the supplied dosing card. Allow to dry for 10 minutes before going into socks/shoes  

## 2023-12-18 NOTE — Progress Notes (Signed)
  Subjective:  Patient ID: Ellen Nelson, female    DOB: September 06, 1955,  MRN: [id number]  Chief Complaint  Patient presents with   Nail Problem    Rm 2 Patient is here to f/u on onychomycosis treatment. Patient concerned about right ankle swelling.    68 y.o. female presents with the above complaint. History confirmed with patient.  She has completed the Diflucan  and notes improvement  Objective:  Physical Exam: warm, good capillary refill, no trophic changes or ulcerative lesions, normal DP and PT pulses, normal sensory exam, and bilateral hallux nail onychomycosis nearly fully resolved.  Diffuse nonpitting nontender edema no evidence of DVT bilateral lower extremities.  Mild tenderness over subtalar joint.  Bilateral lipoma over sinus tarsi and ankle        Assessment:   1. Arthritis of right subtalar joint   2. Lipoma of both lower extremities   3. Onychomycosis       Plan:  Patient was evaluated and treated and all questions answered.  Onychomycosis greatly improved.  Allowed to grow and should not need further antifungal therapy at this point.  We also discussed her swelling in both ankles and discussed this is multifactorial.  She does have some evidence of sinus tarsi syndrome and likely arthritis of the subtalar joint.  We discussed using topical medication such as Voltaren gel which she will use as needed.  She also has asymptomatic lipomas here I would not recommend surgical excision at this point.  Follow-up as needed for all these issues  Return if symptoms worsen or fail to improve.

## 2023-12-26 ENCOUNTER — Ambulatory Visit: Admitting: Internal Medicine

## 2023-12-30 ENCOUNTER — Ambulatory Visit: Admitting: Family Medicine

## 2023-12-30 ENCOUNTER — Encounter: Payer: Self-pay | Admitting: Family Medicine

## 2023-12-30 VITALS — BP 128/86 | HR 74 | Temp 97.6°F | Ht 67.0 in | Wt 225.0 lb

## 2023-12-30 DIAGNOSIS — I7090 Unspecified atherosclerosis: Secondary | ICD-10-CM | POA: Diagnosis not present

## 2023-12-30 DIAGNOSIS — Z01419 Encounter for gynecological examination (general) (routine) without abnormal findings: Secondary | ICD-10-CM

## 2023-12-30 DIAGNOSIS — I341 Nonrheumatic mitral (valve) prolapse: Secondary | ICD-10-CM | POA: Diagnosis not present

## 2023-12-30 DIAGNOSIS — I10 Essential (primary) hypertension: Secondary | ICD-10-CM | POA: Diagnosis not present

## 2023-12-30 DIAGNOSIS — J4520 Mild intermittent asthma, uncomplicated: Secondary | ICD-10-CM | POA: Diagnosis not present

## 2023-12-30 NOTE — Progress Notes (Signed)
 New Patient Office Visit  Subjective    Patient ID: Ellen Nelson, female    DOB: August 12, 1955  Age: 68 y.o. MRN: [id number]  CC:  Chief Complaint  Patient presents with   Establish Care    No concerns    HPI Ellen Nelson presents to establish care  Cardiology - Dr. Levern  Pulmonology- Dr. Darlean  Orthopedist -  Podiatrist-   GI   Discussed the use of AI scribe software for clinical note transcription with the patient, who gave verbal consent to proceed.  History of Present Illness Ellen Nelson is a 68 year old female who presents to establish care and for management of chronic health conditions.   Asthma and respiratory status - Asthma is controlled with Symbicort  twice daily - No current breathing difficulties - History of a past episode of significant difficulty breathing  Musculoskeletal pain and functional limitations - History of right hip replacement nearly two years ago - Ongoing bilateral shoulder pain, with right rotator cuff symptoms for seven years and left shoulder symptoms for two years - Carpal tunnel syndrome present; considering surgery but concerned about recovery while working - Longstanding occupational history of standing for extended periods, believed to contribute to hip issues  Cardiovascular history and symptoms - Atherosclerosis, hyperlipidemia, and hypertension present - states Dr. Levern told her the last EKG suggested mild myocardial infarction, not confirmed - Mitral valve prolapse diagnosed in 1985  Gynecologic and breast health - Hysterectomy performed in her fifties; uncertain if complete - No pelvic exam or Pap smear in many years - Last mammogram performed earlier this year at an outside facility -requests referral to gyn     Manager of [employer]     Outpatient Encounter Medications as of 12/30/2023  Medication Sig   albuterol  (PROVENTIL ) (2.5 MG/3ML) 0.083% nebulizer solution Take 2.5 mg by  nebulization every 6 (six) hours as needed for wheezing or shortness of breath.   albuterol  (VENTOLIN  HFA) 108 (90 Base) MCG/ACT inhaler Inhale 1-2 puffs into the lungs every 6 (six) hours as needed for wheezing or shortness of breath.   budesonide -formoterol  (SYMBICORT ) 80-4.5 MCG/ACT inhaler Take 2 puffs first thing in am and then another 2 puffs about 12 hours later.   budesonide -formoterol  (SYMBICORT ) 80-4.5 MCG/ACT inhaler TAKE 2 PUFFS INTO THE LUNGS FIRST THING IN THE AM AND ANOTHER 2 PUFFS 12 HOURS LATER   cholecalciferol (VITAMIN D) 1000 units tablet Take 2,000 Units by mouth daily.    ciclopirox  (PENLAC ) 8 % solution Apply topically at bedtime. Apply over nail and surrounding skin. Apply daily over previous coat. After seven (7) days, may remove with alcohol and continue cycle. (Patient taking differently: Apply 1 Application topically at bedtime as needed (toenail fungus). Apply over nail and surrounding skin. Apply daily over previous coat. After seven (7) days, may remove with alcohol and continue cycle.)   fexofenadine (ALLEGRA) 60 MG tablet Take 60 mg by mouth daily.    fluticasone  (FLONASE ) 50 MCG/ACT nasal spray Place 2 sprays into both nostrils daily.   losartan  (COZAAR ) 50 MG tablet Take 50 mg by mouth daily.   Multiple Vitamins-Minerals (HAIR SKIN AND NAILS FORMULA PO) Take 1 tablet by mouth daily.   Polyethyl Glycol-Propyl Glycol (SYSTANE OP) Place 1 drop into both eyes in the morning and at bedtime.   [DISCONTINUED] promethazine -dextromethorphan (PROMETHAZINE -DM) 6.25-15 MG/5ML syrup Take 5 mLs by mouth 3 (three) times daily as needed for cough.   No facility-administered encounter medications on file  as of 12/30/2023.    Past Medical History:  Diagnosis Date   Allergy    Arthritis    Asthma    Hypertension    Mitral prolapse    Per patient   Rotator cuff tear    Right shoulder    Past Surgical History:  Procedure Laterality Date   ABDOMINAL HYSTERECTOMY      COLONOSCOPY     RETAINED PLACENTA REMOVAL     32 years ago per patient   TONSILLECTOMY     TOTAL HIP ARTHROPLASTY Right 02/15/2022   Procedure: RIGHT TOTAL HIP ARTHROPLASTY ANTERIOR APPROACH;  Surgeon: Jerri Kay HERO, MD;  Location: [facility] OR;  Service: Orthopedics;  Laterality: Right;  3-C    Family History  Problem Relation Age of Onset   Colon polyps Mother    Hypertension Mother    Hiatal hernia Mother    Heart disease Mother    Colon polyps Father    Colon cancer Father    Dementia Father    Hypertension Father    Cancer - Colon Father    Osteoarthritis Sister    Breast cancer Sister    Heart disease Brother    Early death Brother    Esophageal cancer Neg Hx    Stomach cancer Neg Hx    Rectal cancer Neg Hx     Social History   Socioeconomic History   Marital status: Single    Spouse name: Not on file   Number of children: 2   Years of education: Not on file   Highest education level: Not on file  Occupational History   Occupation: waitress  Tobacco Use   Smoking status: Former    Current packs/day: 0.00    Types: Cigarettes    Quit date: 2008    Years since quitting: 17.6   Smokeless tobacco: Never  Vaping Use   Vaping status: Never Used  Substance and Sexual Activity   Alcohol use: Yes    Comment: seldom   Drug use: Never   Sexual activity: Not Currently    Birth control/protection: Post-menopausal  Other Topics Concern   Not on file  Social History Narrative   Not on file   Social Drivers of Health   Financial Resource Strain: Not on file  Food Insecurity: No Food Insecurity (02/16/2022)   Hunger Vital Sign    Worried About Running Out of Food in the Last Year: Never true    Ran Out of Food in the Last Year: Never true  Transportation Needs: No Transportation Needs (02/16/2022)   PRAPARE - Administrator, Civil Service (Medical): No    Lack of Transportation (Non-Medical): No  Physical Activity: Not on file  Stress: Not on file  Social  Connections: Not on file  Intimate Partner Violence: Not At Risk (02/16/2022)   Humiliation, Afraid, Rape, and Kick questionnaire    Fear of Current or Ex-Partner: No    Emotionally Abused: No    Physically Abused: No    Sexually Abused: No    Review of Systems  Constitutional:  Negative for chills, fever and malaise/fatigue.  Respiratory:  Negative for shortness of breath.   Cardiovascular:  Negative for chest pain, palpitations and leg swelling.  Gastrointestinal:  Negative for abdominal pain, constipation, diarrhea, nausea and vomiting.  Genitourinary:  Negative for dysuria, frequency and urgency.  Neurological:  Negative for dizziness, focal weakness and headaches.  Psychiatric/Behavioral:  Negative for depression. The patient is not nervous/anxious.  Objective    BP 128/86   Pulse 74   Temp 97.6 F (36.4 C) (Temporal)   Ht 5' 7 (1.702 m)   Wt 225 lb (102.1 kg)   SpO2 98%   BMI 35.24 kg/m   Physical Exam Constitutional:      General: She is not in acute distress.    Appearance: She is not ill-appearing.  Eyes:     Extraocular Movements: Extraocular movements intact.     Conjunctiva/sclera: Conjunctivae normal.  Cardiovascular:     Rate and Rhythm: Normal rate.  Pulmonary:     Effort: Pulmonary effort is normal.  Musculoskeletal:     Cervical back: Normal range of motion and neck supple.     Right lower leg: No edema.     Left lower leg: No edema.  Skin:    General: Skin is warm and dry.  Neurological:     General: No focal deficit present.     Mental Status: She is alert and oriented to person, place, and time.     Motor: No weakness.     Coordination: Coordination normal.     Gait: Gait normal.  Psychiatric:        Mood and Affect: Mood normal.        Behavior: Behavior normal.        Thought Content: Thought content normal.         Assessment & Plan:   Problem List Items Addressed This Visit   None Visit Diagnoses       Primary  hypertension    -  Primary     Encounter for breast and pelvic examination       Relevant Orders   Ambulatory referral to Gynecology     Atherosclerosis         MVP (mitral valve prolapse)         Mild intermittent asthma without complication          Assessment and Plan Assessment & Plan Atherosclerosis Atherosclerosis with associated hyperlipidemia and hypertension. No diabetes. Possible mild myocardial infarction noted by previous cardiologist based on EKG, but not confirmed. She expressed concern about lack of communication regarding this finding. - Schedule fasting complete physical including blood work to assess cholesterol levels.  Hypertension -controlled on losartan  50 mg daily   Hyperlipidemia Hyperlipidemia as part of atherosclerosis management.  Asthma Asthma controlled with Symbicort  inhaler twice daily. Recent pulmonary function test performed. She was educated on rinsing mouth after inhaler use to prevent thrush.    Return for fasting CPE at their convenience, AWV with nurse.   Boby Mackintosh, NP-C

## 2023-12-30 NOTE — Patient Instructions (Signed)
 Thank you for trusting [place] with your healthcare.

## 2024-01-18 ENCOUNTER — Encounter: Payer: Self-pay | Admitting: Family Medicine

## 2024-01-18 ENCOUNTER — Ambulatory Visit: Admitting: Family Medicine

## 2024-01-18 VITALS — BP 116/78 | HR 70 | Temp 97.6°F | Ht 67.0 in | Wt 224.0 lb

## 2024-01-18 DIAGNOSIS — Z0001 Encounter for general adult medical examination with abnormal findings: Secondary | ICD-10-CM

## 2024-01-18 DIAGNOSIS — E669 Obesity, unspecified: Secondary | ICD-10-CM

## 2024-01-18 DIAGNOSIS — Z01419 Encounter for gynecological examination (general) (routine) without abnormal findings: Secondary | ICD-10-CM

## 2024-01-18 DIAGNOSIS — I10 Essential (primary) hypertension: Secondary | ICD-10-CM

## 2024-01-18 DIAGNOSIS — E2839 Other primary ovarian failure: Secondary | ICD-10-CM

## 2024-01-18 DIAGNOSIS — Z1159 Encounter for screening for other viral diseases: Secondary | ICD-10-CM

## 2024-01-18 DIAGNOSIS — E559 Vitamin D deficiency, unspecified: Secondary | ICD-10-CM | POA: Diagnosis not present

## 2024-01-18 DIAGNOSIS — I7090 Unspecified atherosclerosis: Secondary | ICD-10-CM

## 2024-01-18 DIAGNOSIS — J4520 Mild intermittent asthma, uncomplicated: Secondary | ICD-10-CM

## 2024-01-18 DIAGNOSIS — R232 Flushing: Secondary | ICD-10-CM

## 2024-01-18 LAB — CBC WITH DIFFERENTIAL/PLATELET
Basophils Absolute: 0 K/uL (ref 0.0–0.1)
Basophils Relative: 0.5 % (ref 0.0–3.0)
Eosinophils Absolute: 0.3 K/uL (ref 0.0–0.7)
Eosinophils Relative: 5.1 % — ABNORMAL HIGH (ref 0.0–5.0)
HCT: 38.4 % (ref 36.0–46.0)
Hemoglobin: 12.6 g/dL (ref 12.0–15.0)
Lymphocytes Relative: 34.3 % (ref 12.0–46.0)
Lymphs Abs: 2 K/uL (ref 0.7–4.0)
MCHC: 32.7 g/dL (ref 30.0–36.0)
MCV: 84.1 fl (ref 78.0–100.0)
Monocytes Absolute: 0.6 K/uL (ref 0.1–1.0)
Monocytes Relative: 10.4 % (ref 3.0–12.0)
Neutro Abs: 2.9 K/uL (ref 1.4–7.7)
Neutrophils Relative %: 49.7 % (ref 43.0–77.0)
Platelets: 247 K/uL (ref 150.0–400.0)
RBC: 4.57 Mil/uL (ref 3.87–5.11)
RDW: 15.6 % — ABNORMAL HIGH (ref 11.5–15.5)
WBC: 5.8 K/uL (ref 4.0–10.5)

## 2024-01-18 LAB — LIPID PANEL
Cholesterol: 105 mg/dL (ref 0–200)
HDL: 61.2 mg/dL (ref >39.00)
LDL Cholesterol: 31 mg/dL (ref 0–99)
NonHDL: 43.85
Total CHOL/HDL Ratio: 2
Triglycerides: 63 mg/dL (ref 0.0–149.0)
VLDL: 12.6 mg/dL (ref 0.0–40.0)

## 2024-01-18 LAB — COMPREHENSIVE METABOLIC PANEL WITH GFR
ALT: 19 U/L (ref 0–35)
AST: 26 U/L (ref 0–37)
Albumin: 4.2 g/dL (ref 3.5–5.2)
Alkaline Phosphatase: 126 U/L — ABNORMAL HIGH (ref 39–117)
BUN: 8 mg/dL (ref 6–23)
CO2: 31 meq/L (ref 19–32)
Calcium: 9 mg/dL (ref 8.4–10.5)
Chloride: 102 meq/L (ref 96–112)
Creatinine, Ser: 0.77 mg/dL (ref 0.40–1.20)
GFR: 79.6 mL/min (ref >60.00)
Glucose, Bld: 94 mg/dL (ref 70–99)
Potassium: 4.4 meq/L (ref 3.5–5.1)
Sodium: 141 meq/L (ref 135–145)
Total Bilirubin: 0.5 mg/dL (ref 0.2–1.2)
Total Protein: 7.3 g/dL (ref 6.0–8.3)

## 2024-01-18 LAB — TSH: TSH: 1.5 u[IU]/mL (ref 0.35–5.50)

## 2024-01-18 LAB — HEMOGLOBIN A1C: Hgb A1c MFr Bld: 6.1 % (ref 4.6–6.5)

## 2024-01-18 LAB — T4, FREE: Free T4: 0.88 ng/dL (ref 0.60–1.60)

## 2024-01-18 LAB — VITAMIN D 25 HYDROXY (VIT D DEFICIENCY, FRACTURES): VITD: 46.24 ng/mL (ref 30.00–100.00)

## 2024-01-18 NOTE — Progress Notes (Signed)
 Complete physical exam  Patient: Ellen Nelson   DOB: 1956/02/20   68 y.o. Female  MRN: [id number]  Subjective:    Chief Complaint  Patient presents with   Annual Exam    fasting   She is here for a complete physical exam.   Discussed the use of AI scribe software for clinical note transcription with the patient, who gave verbal consent to proceed.  History of Present Illness Ellen Nelson is a 68 year old female who presents for a fasting, complete physical exam.  Hypertension - Diagnosed hypertension - No medication issues   Asthma and allergic symptoms - Asthma with intermittent use of Symbicort  - Recent episode required Symbicort  for approximately one week - Asthma exacerbation in February following pneumonia and influenza, leading to intermittent steroid use over four months - History of allergies - Received pneumonia vaccine two years ago, unsure of type  Respiratory infections and sinusitis - Pneumonia and influenza in February - Subsequent sinus infection following respiratory illness  Gynecologic and menopausal symptoms - History of hysterectomy in her fifties, unsure if oophorectomy was performed - Occasional night sweats, not bothersome  Breast and bone health - Mammogram completed earlier this year at [facility]  - Due for bone density test  Cardiac symptoms and valvular disease - Mitral valve abnormality diagnosed in 1985 - Followed by cardiologist every three months - Ankle swelling present, not new or worse   Peripheral soft tissue and musculoskeletal symptoms - Lipomas around ankles - Arthritis in right foot  Mood and lifestyle - Mood improved - Diet improved - Plans to resume regular exercise  Gastrointestinal and genitourinary symptoms - No changes in bowel habits - No urinary problems    Health Maintenance  Topic Date Due   Medicare Annual Wellness Visit  Never done   Hepatitis C Screening  Never done   Pneumococcal Vaccine for age  over 41 (1 of 2 - PCV) Never done   DTaP/Tdap/Td vaccine (2 - Tdap) 10/16/2003   Zoster (Shingles) Vaccine (1 of 2) Never done   DEXA scan (bone density measurement)  Never done   Flu Shot  12/23/2023   COVID-19 Vaccine (1 - 2024-25 season) 02/03/2024*   Mammogram  05/06/2025   Colon Cancer Screening  06/06/2030   HPV Vaccine  Aged Out   Meningitis B Vaccine  Aged Out  *Topic was postponed. The date shown is not the original due date.    Wears seatbelt always, smoke detectors in home and functioning, does not text while driving, feels safe in home environment.  Depression screening:    01/18/2024    8:18 AM 12/30/2023    3:09 PM  Depression screen PHQ 2/9  Decreased Interest 2 0  Down, Depressed, Hopeless 0 0  PHQ - 2 Score 2 0  Altered sleeping 1   Tired, decreased energy 1   Change in appetite 1   Feeling bad or failure about yourself  0   Trouble concentrating 1   Moving slowly or fidgety/restless 0   Suicidal thoughts 0   PHQ-9 Score 6    Anxiety Screening:     No data to display          Vision:Within last year and Dental: No current dental problems and Receives regular dental care  Patient Active Problem List   Diagnosis Date Noted   Asthmatic bronchitis , chronic (HCC) 11/04/2023   Right carpal tunnel syndrome 08/03/2023   Left carpal tunnel syndrome 08/03/2023   Status  post total replacement of right hip 02/15/2022   Primary osteoarthritis of right hip 01/12/2022   Bilateral hand pain 11/21/2020   Positive ANA (antinuclear antibody) 11/21/2020   Nonspecific syndrome suggestive of viral illness 05/31/2020   Biceps tendinopathy of right upper extremity 12/04/2019   Complete tear of right rotator cuff 09/05/2019   Impingement syndrome of right shoulder 09/05/2019   Past Medical History:  Diagnosis Date   Allergy    Arthritis    Asthma    Hypertension    Mitral prolapse    Per patient   Rotator cuff tear    Right shoulder   Past Surgical History:   Procedure Laterality Date   ABDOMINAL HYSTERECTOMY     COLONOSCOPY     RETAINED PLACENTA REMOVAL     32 years ago per patient   TONSILLECTOMY     TOTAL HIP ARTHROPLASTY Right 02/15/2022   Procedure: RIGHT TOTAL HIP ARTHROPLASTY ANTERIOR APPROACH;  Surgeon: Jerri Kay HERO, MD;  Location: [facility] OR;  Service: Orthopedics;  Laterality: Right;  3-C   Social History   Tobacco Use   Smoking status: Former    Current packs/day: 0.00    Types: Cigarettes    Quit date: 2008    Years since quitting: 17.6   Smokeless tobacco: Never  Vaping Use   Vaping status: Never Used  Substance Use Topics   Alcohol use: Yes    Comment: seldom   Drug use: Never      Patient Care Team: Lendia Boby CROME, NP-C as PCP - General (Family Medicine)   Outpatient Medications Prior to Visit  Medication Sig   albuterol  (PROVENTIL ) (2.5 MG/3ML) 0.083% nebulizer solution Take 2.5 mg by nebulization every 6 (six) hours as needed for wheezing or shortness of breath.   albuterol  (VENTOLIN  HFA) 108 (90 Base) MCG/ACT inhaler Inhale 1-2 puffs into the lungs every 6 (six) hours as needed for wheezing or shortness of breath.   budesonide -formoterol  (SYMBICORT ) 80-4.5 MCG/ACT inhaler Take 2 puffs first thing in am and then another 2 puffs about 12 hours later.   budesonide -formoterol  (SYMBICORT ) 80-4.5 MCG/ACT inhaler TAKE 2 PUFFS INTO THE LUNGS FIRST THING IN THE AM AND ANOTHER 2 PUFFS 12 HOURS LATER   cholecalciferol (VITAMIN D ) 1000 units tablet Take 2,000 Units by mouth daily.    ciclopirox  (PENLAC ) 8 % solution Apply topically at bedtime. Apply over nail and surrounding skin. Apply daily over previous coat. After seven (7) days, may remove with alcohol and continue cycle. (Patient taking differently: Apply 1 Application topically at bedtime as needed (toenail fungus). Apply over nail and surrounding skin. Apply daily over previous coat. After seven (7) days, may remove with alcohol and continue cycle.)   fexofenadine  (ALLEGRA) 60 MG tablet Take 60 mg by mouth daily.    fluticasone  (FLONASE ) 50 MCG/ACT nasal spray Place 2 sprays into both nostrils daily.   losartan  (COZAAR ) 50 MG tablet Take 50 mg by mouth daily.   Multiple Vitamins-Minerals (HAIR SKIN AND NAILS FORMULA PO) Take 1 tablet by mouth daily.   Polyethyl Glycol-Propyl Glycol (SYSTANE OP) Place 1 drop into both eyes in the morning and at bedtime.   No facility-administered medications prior to visit.    Review of Systems  Constitutional:  Negative for chills, fever, malaise/fatigue and weight loss.       Night sweats/hot flashes  HENT:  Negative for congestion, ear pain, sinus pain and sore throat.   Eyes:  Negative for blurred vision, double vision and pain.  Respiratory:  Negative for cough, shortness of breath and wheezing.   Cardiovascular:  Negative for chest pain, palpitations and leg swelling.  Gastrointestinal:  Negative for abdominal pain, constipation, diarrhea, nausea and vomiting.  Genitourinary:  Negative for dysuria, frequency and urgency.  Musculoskeletal:  Negative for back pain, joint pain and myalgias.  Skin:  Negative for rash.  Neurological:  Negative for dizziness, tingling, focal weakness and headaches.  Endo/Heme/Allergies:  Does not bruise/bleed easily.  Psychiatric/Behavioral:  Negative for depression, memory loss and suicidal ideas. The patient is not nervous/anxious.        Objective:    BP 116/78   Pulse 70   Temp 97.6 F (36.4 C) (Temporal)   Ht 5' 7 (1.702 m)   Wt 224 lb (101.6 kg)   SpO2 98%   BMI 35.08 kg/m  BP Readings from Last 3 Encounters:  01/18/24 116/78  12/30/23 128/86  11/04/23 128/74   Wt Readings from Last 3 Encounters:  01/18/24 224 lb (101.6 kg)  12/30/23 225 lb (102.1 kg)  12/13/23 220 lb (99.8 kg)    Physical Exam Constitutional:      General: She is not in acute distress.    Appearance: She is obese. She is not ill-appearing.  HENT:     Right Ear: Tympanic membrane, ear  canal and external ear normal.     Left Ear: Tympanic membrane, ear canal and external ear normal.     Nose: Nose normal.     Mouth/Throat:     Mouth: Mucous membranes are moist.     Pharynx: Oropharynx is clear.  Eyes:     Extraocular Movements: Extraocular movements intact.     Conjunctiva/sclera: Conjunctivae normal.     Pupils: Pupils are equal, round, and reactive to light.  Neck:     Thyroid : No thyroid  mass, thyromegaly or thyroid  tenderness.  Cardiovascular:     Rate and Rhythm: Normal rate and regular rhythm.     Pulses: Normal pulses.     Heart sounds: Normal heart sounds.  Pulmonary:     Effort: Pulmonary effort is normal.     Breath sounds: Normal breath sounds.  Abdominal:     General: Bowel sounds are normal.     Palpations: Abdomen is soft.     Tenderness: There is no abdominal tenderness. There is no right CVA tenderness, left CVA tenderness, guarding or rebound.  Musculoskeletal:        General: Normal range of motion.     Cervical back: Normal range of motion and neck supple. No tenderness.     Right lower leg: No edema.     Left lower leg: No edema.  Lymphadenopathy:     Cervical: No cervical adenopathy.  Skin:    General: Skin is warm and dry.     Findings: No lesion or rash.  Neurological:     General: No focal deficit present.     Mental Status: She is alert and oriented to person, place, and time.     Cranial Nerves: No cranial nerve deficit.     Sensory: No sensory deficit.     Motor: No weakness.     Gait: Gait normal.  Psychiatric:        Mood and Affect: Mood normal.        Behavior: Behavior normal.        Thought Content: Thought content normal.      Results for orders placed or performed in visit on 01/18/24  [facility] MAMMOGRAPHY  Result Value Ref Range   [facility] Mammogram 0-4 Bi-Rad 0-4 Bi-Rad, Self Reported Normal      Assessment & Plan:    Routine Health Maintenance and Physical Exam Problem List Items Addressed This Visit   None Visit  Diagnoses       Encounter for general adult medical examination with abnormal findings    -  Primary     Encounter for breast and pelvic examination       Relevant Orders   Ambulatory referral to Gynecology     Estrogen deficiency       Relevant Orders   DG Bone Density     Primary hypertension       Relevant Orders   CBC with Differential/Platelet   Comprehensive metabolic panel with GFR   TSH   T4, free     Atherosclerosis       Relevant Orders   Lipid panel     Hot flashes       Relevant Orders   CBC with Differential/Platelet   Comprehensive metabolic panel with GFR   TSH   T4, free     Mild intermittent asthma without complication         Encounter for screening for other viral diseases       Relevant Orders   Hepatitis C antibody     Vitamin D  deficiency       Relevant Orders   VITAMIN D  25 Hydroxy (Vit-D Deficiency, Fractures)     Obesity (BMI 30-39.9)       Relevant Orders   CBC with Differential/Platelet   Comprehensive metabolic panel with GFR   Hemoglobin A1c   Lipid panel   TSH   T4, free       Assessment and Plan Assessment & Plan Adult Wellness Visit Here for a complete physical exam. The Medicare wellness portion of the visit will be scheduled separately with the nurse. - Schedule Medicare wellness visit with nurse Preventive health care reviewed.  Counseling on healthy lifestyle including diet and exercise.  Recommend regular dental and eye exams.  Immunizations reviewed.  Discussed safety.   Hypertension Hypertension is well controlled. Continue current medication regimen.   Asthma Asthma is well controlled. Uses Symbicort  as needed and has not required it recently after a brief episode.  Allergic rhinitis Allergic rhinitis is well controlled.  Edema of lower extremities Edema noted in the lower extremities. Informed by foot doctor of lipomas around ankles.  Primary osteoarthritis of right ankle and foot Primary osteoarthritis in the  right ankle and foot. Experiences swelling and has been advised by foot doctor on management if it becomes irritated.  Mitral valve disorder, unspecified Diagnosed with mitral valve disorder in 1985. Under regular monitoring by a cardiologist, Dr. Levern, every three months.  Estrogen deficiency, postmenopausal state Postmenopausal state with estrogen deficiency. Experiences intermittent night sweats but is accustomed to them and does not find them bothersome. - Discuss night sweats with OB GYN if desired  Working on improving diet and exercise habits.  General Health Maintenance Routine screenings and preventative health measures discussed. Mammogram completed at [facility] in January or February. Bone density test never done. Hepatitis C screening and pneumonia vaccine discussed. - Track down mammogram results from [facility] - Schedule bone density test at [facility] - Complete hepatitis C screening - Verify which pneumonia vaccine was received and obtain the other one if needed     Return in about 4 weeks (around 02/15/2024) for AWV with nurse.  Boby Mackintosh, NP-C

## 2024-01-18 NOTE — Patient Instructions (Addendum)
  Track down your pneumonia vaccine.   Our records show that you are overdue for pneumonia vaccine, Tdap and Shingrix vaccines.   Check with your pharmacy and insurance regarding these vaccines.

## 2024-01-19 ENCOUNTER — Ambulatory Visit: Payer: Self-pay | Admitting: Family Medicine

## 2024-01-19 LAB — HEPATITIS C ANTIBODY: Hepatitis C Ab: NONREACTIVE

## 2024-01-27 ENCOUNTER — Ambulatory Visit (INDEPENDENT_AMBULATORY_CARE_PROVIDER_SITE_OTHER)

## 2024-01-27 VITALS — Ht 66.5 in | Wt 224.0 lb

## 2024-01-27 DIAGNOSIS — Z0000 Encounter for general adult medical examination without abnormal findings: Secondary | ICD-10-CM

## 2024-01-27 NOTE — Progress Notes (Signed)
 Subjective:   Ellen Nelson is a 68 y.o. who presents for a Medicare Wellness preventive visit.  As a reminder, Annual Wellness Visits don't include a physical exam, and some assessments may be limited, especially if this visit is performed virtually. We may recommend an in-person follow-up visit with your provider if needed.  Visit Complete: Virtual I connected with  Ellen Nelson on 01/27/24 by a audio enabled telemedicine application and verified that I am speaking with the correct person using two identifiers.  Patient Location: Home  Provider Location: Office/Clinic  I discussed the limitations of evaluation and management by telemedicine. The patient expressed understanding and agreed to proceed.  Vital Signs: Because this visit was a virtual/telehealth visit, some criteria may be missing or patient reported. Any vitals not documented were not able to be obtained and vitals that have been documented are patient reported.  VideoDeclined- This patient declined Librarian, academic. Therefore the visit was completed with audio only.  Persons Participating in Visit: Patient.  AWV Questionnaire: No: Patient Medicare AWV questionnaire was not completed prior to this visit.  Cardiac Risk Factors include: advanced age (>61men, >73 women);obesity (BMI >30kg/m2)     Objective:    Today's Vitals   01/27/24 1307  Weight: 224 lb (101.6 kg)  Height: 5' 6.5 (1.689 m)   Body mass index is 35.61 kg/m.     01/27/2024    1:06 PM 06/08/2022   10:55 AM 02/16/2022    1:37 AM 02/16/2022    1:00 AM 02/05/2022    8:06 AM  Advanced Directives  Does Patient Have a Medical Advance Directive? No Yes  No No  Type of Special educational needs teacher of [place];Living will     Does patient want to make changes to medical advance directive?  No - Patient declined  No - Patient declined No - Patient declined  Would patient like information on creating a medical  advance directive? No - Patient declined  No - Patient declined      Current Medications (verified) Outpatient Encounter Medications as of 01/27/2024  Medication Sig   albuterol  (PROVENTIL ) (2.5 MG/3ML) 0.083% nebulizer solution Take 2.5 mg by nebulization every 6 (six) hours as needed for wheezing or shortness of breath.   albuterol  (VENTOLIN  HFA) 108 (90 Base) MCG/ACT inhaler Inhale 1-2 puffs into the lungs every 6 (six) hours as needed for wheezing or shortness of breath.   budesonide -formoterol  (SYMBICORT ) 80-4.5 MCG/ACT inhaler Take 2 puffs first thing in am and then another 2 puffs about 12 hours later.   budesonide -formoterol  (SYMBICORT ) 80-4.5 MCG/ACT inhaler TAKE 2 PUFFS INTO THE LUNGS FIRST THING IN THE AM AND ANOTHER 2 PUFFS 12 HOURS LATER   cholecalciferol (VITAMIN D ) 1000 units tablet Take 2,000 Units by mouth daily.    ciclopirox  (PENLAC ) 8 % solution Apply topically at bedtime. Apply over nail and surrounding skin. Apply daily over previous coat. After seven (7) days, may remove with alcohol and continue cycle.   fexofenadine (ALLEGRA) 60 MG tablet Take 60 mg by mouth daily.    fluticasone  (FLONASE ) 50 MCG/ACT nasal spray Place 2 sprays into both nostrils daily.   losartan  (COZAAR ) 50 MG tablet Take 50 mg by mouth daily.   Multiple Vitamins-Minerals (HAIR SKIN AND NAILS FORMULA PO) Take 1 tablet by mouth daily.   Polyethyl Glycol-Propyl Glycol (SYSTANE OP) Place 1 drop into both eyes in the morning and at bedtime.   No facility-administered encounter medications on file as of  01/27/2024.    Allergies (verified) Percocet [oxycodone-acetaminophen ] and Sulfa antibiotics   History: Past Medical History:  Diagnosis Date   Allergy    Arthritis    Asthma    Hypertension    Mitral prolapse    Per patient   Rotator cuff tear    Right shoulder   Past Surgical History:  Procedure Laterality Date   ABDOMINAL HYSTERECTOMY     COLONOSCOPY     RETAINED PLACENTA REMOVAL     32  years ago per patient   TONSILLECTOMY     TOTAL HIP ARTHROPLASTY Right 02/15/2022   Procedure: RIGHT TOTAL HIP ARTHROPLASTY ANTERIOR APPROACH;  Surgeon: Jerri Kay HERO, MD;  Location: [facility] OR;  Service: Orthopedics;  Laterality: Right;  3-C   Family History  Problem Relation Age of Onset   Colon polyps Mother    Hypertension Mother    Hiatal hernia Mother    Heart disease Mother    Colon polyps Father    Colon cancer Father    Dementia Father    Hypertension Father    Cancer - Colon Father    Osteoarthritis Sister    Breast cancer Sister    Heart disease Brother    Early death Brother    Esophageal cancer Neg Hx    Stomach cancer Neg Hx    Rectal cancer Neg Hx    Social History   Socioeconomic History   Marital status: Single    Spouse name: Not on file   Number of children: 2   Years of education: Not on file   Highest education level: Not on file  Occupational History   Occupation: waitress  Tobacco Use   Smoking status: Former    Current packs/day: 0.00    Types: Cigarettes    Quit date: 2008    Years since quitting: 17.6   Smokeless tobacco: Never  Vaping Use   Vaping status: Never Used  Substance and Sexual Activity   Alcohol use: Not Currently   Drug use: Never   Sexual activity: Not Currently    Birth control/protection: Post-menopausal  Other Topics Concern   Not on file  Social History Narrative   Single   Social Drivers of Health   Financial Resource Strain: Low Risk  (01/27/2024)   Overall Financial Resource Strain (CARDIA)    Difficulty of Paying Living Expenses: Not hard at all  Food Insecurity: No Food Insecurity (01/27/2024)   Hunger Vital Sign    Worried About Running Out of Food in the Last Year: Never true    Ran Out of Food in the Last Year: Never true  Transportation Needs: No Transportation Needs (01/27/2024)   PRAPARE - Administrator, Civil Service (Medical): No    Lack of Transportation (Non-Medical): No  Physical Activity:  Inactive (01/27/2024)   Exercise Vital Sign    Days of Exercise per Week: 0 days    Minutes of Exercise per Session: 0 min  Stress: No Stress Concern Present (01/27/2024)   [employer] of Occupational Health - Occupational Stress Questionnaire    Feeling of Stress: Not at all  Social Connections: Socially Isolated (01/27/2024)   Social Connection and Isolation Panel    Frequency of Communication with Friends and Family: More than three times a week    Frequency of Social Gatherings with Friends and Family: Three times a week    Attends Religious Services: Never    Active Member of Clubs or Organizations: No    Attends  Club or Organization Meetings: Never    Marital Status: Never married    Tobacco Counseling Counseling given: Not Answered    Clinical Intake:  Pre-visit preparation completed: Yes  Pain : No/denies pain     BMI - recorded: 35.61 Nutritional Risks: None Diabetes: No  Lab Results  Component Value Date   HGBA1C 6.1 01/18/2024     How often do you need to have someone help you when you read instructions, pamphlets, or other written materials from your doctor or pharmacy?: 1 - Never  Interpreter Needed?: No  Information entered by :: Verdie Saba, CMA   Activities of Daily Living     01/27/2024    1:10 PM  In your present state of health, do you have any difficulty performing the following activities:  Hearing? 0  Vision? 0  Difficulty concentrating or making decisions? 0  Walking or climbing stairs? 0  Dressing or bathing? 0  Doing errands, shopping? 0  Preparing Food and eating ? N  Using the Toilet? N  In the past six months, have you accidently leaked urine? Y  Comment wears a pantyliner  Do you have problems with loss of bowel control? N  Managing your Medications? N  Managing your Finances? N  Housekeeping or managing your Housekeeping? N    Patient Care Team: Lendia Boby CROME, NP-C as PCP - General (Family Medicine) Joshua Rush, OD  (Optometry)  I have updated your Care Teams any recent Medical Services you may have received from other providers in the past year.     Assessment:   This is a routine wellness examination for [place].  Hearing/Vision screen Hearing Screening - Comments:: Denies hearing difficulties   Vision Screening - Comments:: Wears rx glasses - up to date with routine eye exams with [facility]   Goals Addressed               This Visit's Progress     Patient Stated (pt-stated)        Patient stated she plans to join a gym and do more walking and watching her diet       Depression Screen     01/27/2024    1:11 PM 01/18/2024    8:18 AM 12/30/2023    3:09 PM  PHQ 2/9 Scores  PHQ - 2 Score 0 2 0  PHQ- 9 Score 0 6     Fall Risk     01/27/2024    1:10 PM 12/30/2023    3:09 PM  Fall Risk   Falls in the past year? 0 0  Number falls in past yr: 0 0  Injury with Fall? 0 0  Risk for fall due to : No Fall Risks No Fall Risks  Follow up Falls evaluation completed;Falls prevention discussed Falls evaluation completed    MEDICARE RISK AT HOME:  Medicare Risk at Home Any stairs in or around the home?: Yes If so, are there any without handrails?: No Home free of loose throw rugs in walkways, pet beds, electrical cords, etc?: Yes Adequate lighting in your home to reduce risk of falls?: Yes Life alert?: No Use of a cane, walker or w/c?: No Grab bars in the bathroom?: No Shower chair or bench in shower?: Yes Elevated toilet seat or a handicapped toilet?: Yes  TIMED UP AND GO:  Was the test performed?  No  Cognitive Function: 6CIT completed        01/27/2024    1:15 PM  6CIT Screen  What Year? 0 points  What month? 0 points  What time? 0 points  Count back from 20 0 points  Months in reverse 0 points  Repeat phrase 0 points  Total Score 0 points    Immunizations Immunization History  Administered Date(s) Administered   Influenza Inj Mdck Quad Pf 02/06/2019    Influenza,inj,Quad PF,6+ Mos 05/25/2018   Td 10/15/1993    Screening Tests Health Maintenance  Topic Date Due   Pneumococcal Vaccine: 50+ Years (1 of 2 - PCV) Never done   DTaP/Tdap/Td (2 - Tdap) 10/16/2003   Zoster Vaccines- Shingrix (1 of 2) Never done   DEXA SCAN  Never done   Influenza Vaccine  12/23/2023   COVID-19 Vaccine (1 - 2024-25 season) Never done   Medicare Annual Wellness (AWV)  01/26/2025   MAMMOGRAM  05/06/2025   Colonoscopy  06/06/2030   Hepatitis C Screening  Completed   HPV VACCINES  Aged Out   Meningococcal B Vaccine  Aged Out    Health Maintenance Items Addressed:  PCP ordered a DEXA scan & pt will schedule for 2025.  Additional Screening:  Vision Screening: Recommended annual ophthalmology exams for early detection of glaucoma and other disorders of the eye. Is the patient up to date with their annual eye exam? Yes Who is the provider or what is the name of the office in which the patient attends annual eye exams? [facility] - Dr Norleen Nelson  Dental Screening: Recommended annual dental exams for proper oral hygiene  Community Resource Referral / Chronic Care Management: CRR required this visit?  No   CCM required this visit?  No   Plan:    I have personally reviewed and noted the following in the patient's chart:   Medical and social history Use of alcohol, tobacco or illicit drugs  Current medications and supplements including opioid prescriptions. Patient is not currently taking opioid prescriptions. Functional ability and status Nutritional status Physical activity Advanced directives List of other physicians Hospitalizations, surgeries, and ER visits in previous 12 months Vitals Screenings to include cognitive, depression, and falls Referrals and appointments  In addition, I have reviewed and discussed with patient certain preventive protocols, quality metrics, and best practice recommendations. A written personalized care plan for  preventive services as well as general preventive health recommendations were provided to patient.   Verdie CHRISTELLA Saba, CMA   01/27/2024   After Visit Summary: (MyChart) Due to this being a telephonic visit, the after visit summary with patients personalized plan was offered to patient via MyChart   Notes: Scheduled a 1-yr Physical w/PCP for 02/02/2024

## 2024-01-27 NOTE — Patient Instructions (Addendum)
 Ellen Nelson,  Thank you for taking the time for your Medicare Wellness Visit. I appreciate your continued commitment to your health goals. Please review the care plan we discussed, and feel free to reach out if I can assist you further.  Medicare recommends these wellness visits once per year to help you and your care team stay ahead of potential health issues. These visits are designed to focus on prevention, allowing your provider to concentrate on managing your acute and chronic conditions during your regular appointments.  Please note that Annual Wellness Visits do not include a physical exam. Some assessments may be limited, especially if the visit was conducted virtually. If needed, we may recommend a separate in-person follow-up with your provider.  Ongoing Care Seeing your primary care provider every 3 to 6 months helps us  monitor your health and provide consistent, personalized care.   Referrals If a referral was made during today's visit and you haven't received any updates within two weeks, please contact the referred provider directly to check on the status.  Recommended Screenings:  Health Maintenance  Topic Date Due   Pneumococcal Vaccine for age over 61 (1 of 2 - PCV) Never done   DTaP/Tdap/Td vaccine (2 - Tdap) 10/16/2003   Zoster (Shingles) Vaccine (1 of 2) Never done   DEXA scan (bone density measurement)  Never done   Flu Shot  12/23/2023   COVID-19 Vaccine (1 - 2024-25 season) Never done   Medicare Annual Wellness Visit  01/26/2025   Mammogram  05/06/2025   Colon Cancer Screening  06/06/2030   Hepatitis C Screening  Completed   HPV Vaccine  Aged Out   Meningitis B Vaccine  Aged Out       01/27/2024    1:06 PM  Advanced Directives  Does Patient Have a Medical Advance Directive? No  Would patient like information on creating a medical advance directive? No - Patient declined   Advance Care Planning is important because it: Ensures you receive medical care that  aligns with your values, goals, and preferences. Provides guidance to your family and loved ones, reducing the emotional burden of decision-making during critical moments.  Vision: Annual vision screenings are recommended for early detection of glaucoma, cataracts, and diabetic retinopathy. These exams can also reveal signs of chronic conditions such as diabetes and high blood pressure.  Dental: Annual dental screenings help detect early signs of oral cancer, gum disease, and other conditions linked to overall health, including heart disease and diabetes.

## 2024-02-18 DIAGNOSIS — Z809 Family history of malignant neoplasm, unspecified: Secondary | ICD-10-CM | POA: Diagnosis not present

## 2024-02-18 DIAGNOSIS — Z7951 Long term (current) use of inhaled steroids: Secondary | ICD-10-CM | POA: Diagnosis not present

## 2024-02-18 DIAGNOSIS — Z87891 Personal history of nicotine dependence: Secondary | ICD-10-CM | POA: Diagnosis not present

## 2024-02-18 DIAGNOSIS — R32 Unspecified urinary incontinence: Secondary | ICD-10-CM | POA: Diagnosis not present

## 2024-02-18 DIAGNOSIS — J45909 Unspecified asthma, uncomplicated: Secondary | ICD-10-CM | POA: Diagnosis not present

## 2024-02-18 DIAGNOSIS — Z8249 Family history of ischemic heart disease and other diseases of the circulatory system: Secondary | ICD-10-CM | POA: Diagnosis not present

## 2024-02-18 DIAGNOSIS — Z823 Family history of stroke: Secondary | ICD-10-CM | POA: Diagnosis not present

## 2024-02-18 DIAGNOSIS — M1990 Unspecified osteoarthritis, unspecified site: Secondary | ICD-10-CM | POA: Diagnosis not present

## 2024-02-18 DIAGNOSIS — Z6836 Body mass index (BMI) 36.0-36.9, adult: Secondary | ICD-10-CM | POA: Diagnosis not present

## 2024-02-18 DIAGNOSIS — I10 Essential (primary) hypertension: Secondary | ICD-10-CM | POA: Diagnosis not present

## 2024-02-19 NOTE — Progress Notes (Unsigned)
 Ellen Nelson, female    DOB: 08-23-1955   MRN: [id number]   Brief patient profile:  25 yobf quit smoking 2008  referred to pulmonary clinic 11/04/2023 by DR Levern  for AB      Developed  rhinitis > [place] rx shots  x 4-5 month  then sob  dx as asthma better with saba   Pt not previously seen by PCCM service.    History of Present Illness  11/04/2023  Pulmonary/ 1st office eval/Vandana Haman  Chief Complaint  Patient presents with   Shortness of Breath    Consult- Pt states there are times when she feels like she can not breathe. Had the flu in February, Sinus infection in March, Asthma flare up in April. On prednisone  each month. Pt states the s.o.b is random, not towards exertion or rest but can come anytime  Dyspnea:  much better but still needing albuterol  @ increased freq  for over a month and 3 cycles of pred since Feb though now hasn't used pred in a about a month  Not limited by breathing from desired activities   Cough: some dry sporadic daytime cough  Sleep: bed flat / cx pillow  SABA use: not x 2 weeks  02 [id number]:[id number]  Rec Plan A = Automatic = Always=    Symbicort  80   Work on inhaler technique:    Plan B = Backup (to supplement plan A, not to replace it) Only use your albuterol  inhaler as a rescue medication Plan C = Crisis (instead of Plan B but only if Plan B stops working) - only use your albuterol  nebulizer if you first try Plan B  Plan D = Deltasone  (prednisone )   - PFTs 11/18/23  c/w mild asthma with ERV 7% at wt 220   02/20/2024  f/u ov/Brenan [place] re: AB  maint on symicort 80   Chief Complaint  Patient presents with   Asthma    Doing well.  Dyspnea:  Not limited by breathing from desired activities    Cough: none  Sleeping: flat bed 3 pillows s resp cc  SABA use: none  02: none     No obvious day to day or daytime variability or assoc excess/ purulent sputum or mucus plugs or hemoptysis or cp or chest tightness, subjective wheeze or overt sinus or hb symptoms.     Also denies any obvious fluctuation of symptoms with weather or environmental changes or other aggravating or alleviating factors except as outlined above   No unusual exposure hx or h/o childhood pna/ asthma or knowledge of premature birth.  Current Allergies, Complete Past Medical History, Past Surgical History, Family History, and Social History were reviewed in [employer] record.  ROS  The following are not active complaints unless bolded Hoarseness, sore throat, dysphagia, dental problems, itching, sneezing,  nasal congestion or discharge of excess mucus or purulent secretions, ear ache,   fever, chills, sweats, unintended wt loss or wt gain, classically pleuritic or exertional cp,  orthopnea pnd or arm/hand swelling  or leg swelling, presyncope, palpitations, abdominal pain, anorexia, nausea, vomiting, diarrhea  or change in bowel habits or change in bladder habits, change in stools or change in urine, dysuria, hematuria,  rash, arthralgias, visual complaints, headache, numbness, weakness or ataxia or problems with walking or coordination,  change in mood or  memory.        Current Meds  Medication Sig   albuterol  (PROVENTIL ) (2.5 MG/3ML) 0.083% nebulizer solution Take 2.5 mg by  nebulization every 6 (six) hours as needed for wheezing or shortness of breath.   albuterol  (VENTOLIN  HFA) 108 (90 Base) MCG/ACT inhaler Inhale 1-2 puffs into the lungs every 6 (six) hours as needed for wheezing or shortness of breath.   budesonide -formoterol  (SYMBICORT ) 80-4.5 MCG/ACT inhaler Take 2 puffs first thing in am and then another 2 puffs about 12 hours later.   budesonide -formoterol  (SYMBICORT ) 80-4.5 MCG/ACT inhaler TAKE 2 PUFFS INTO THE LUNGS FIRST THING IN THE AM AND ANOTHER 2 PUFFS 12 HOURS LATER   cholecalciferol (VITAMIN D ) 1000 units tablet Take 2,000 Units by mouth daily.    ciclopirox  (PENLAC ) 8 % solution Apply topically at bedtime. Apply over nail and surrounding skin.  Apply daily over previous coat. After seven (7) days, may remove with alcohol and continue cycle.   fexofenadine (ALLEGRA) 60 MG tablet Take 60 mg by mouth daily.    fluticasone  (FLONASE ) 50 MCG/ACT nasal spray Place 2 sprays into both nostrils daily.   losartan  (COZAAR ) 100 MG tablet Take 100 mg by mouth daily.   Multiple Vitamins-Minerals (HAIR SKIN AND NAILS FORMULA PO) Take 1 tablet by mouth daily.   Polyethyl Glycol-Propyl Glycol (SYSTANE OP) Place 1 drop into both eyes in the morning and at bedtime.         Past Medical History:  Diagnosis Date   Allergy    Arthritis    Asthma    Hypertension    Mitral prolapse    Per patient   Rotator cuff tear    Right shoulder      Objective:     Wt Readings from Last 3 Encounters:  02/20/24 224 lb 12.8 oz (102 kg)  01/27/24 224 lb (101.6 kg)  01/18/24 224 lb (101.6 kg)    Vital signs reviewed  02/20/2024  - Note at rest 02 sats  95% on RA   General appearance:    Mod obese (by BMI bf) amb bf nad     HEENT : Oropharynx  clear      Nasal turbinates nl   NECK :  without  apparent JVD/ palpable Nodes/TM    LUNGS: no acc muscle use,  Nl contour chest which is clear to A and P bilaterally without cough on insp or exp maneuvers   CV:  RRR  no s3 or murmur or increase in P2, and lymphodema both LE's with elastic hose in place   ABD:  quite obese soft and nontender   MS:  Gait nl   ext warm without deformities Or obvious joint restrictions  calf tenderness, cyanosis or clubbing    SKIN: warm and dry without lesions    NEURO:  alert, approp, nl sensorium with  no motor or cerebellar deficits apparent.       Assessment     Assessment & Plan Asthmatic bronchitis , chronic (HCC) Onset 2008 while smoking improved some but never completely better  - 11/04/2023  After extensive coaching inhaler device,  effectiveness =    75% with hfa/ start symbicort  80  2bid  - PFTs 11/18/23  c/w mild asthma flows nl p saba on day she did not  take symbicort  with ERV 7% at wt 220   All goals of chronic asthma control met including optimal function and elimination of symptoms with minimal need for rescue therapy.  Contingencies discussed in full including contacting this office immediately if not controlling the symptoms using the rule of two's.     Advised bed blocks to help offet her  abd wt distribution from affecting her diaphragms or contributing to noct GERD    F/u can be q 12 months, sooner if needed    AVS  Patient Instructions  No change in recommends except you should consider bed blocks for your headboard - 6 to 8 inches   Please schedule a follow up visit in  12  months but call sooner if needed    Ozell America, MD 02/21/2024

## 2024-02-20 ENCOUNTER — Ambulatory Visit: Admitting: Internal Medicine

## 2024-02-20 ENCOUNTER — Encounter: Payer: Self-pay | Admitting: Internal Medicine

## 2024-02-20 VITALS — BP 126/74 | HR 88 | Temp 97.6°F | Ht 66.5 in | Wt 224.8 lb

## 2024-02-20 DIAGNOSIS — Z87891 Personal history of nicotine dependence: Secondary | ICD-10-CM

## 2024-02-20 DIAGNOSIS — J4489 Other specified chronic obstructive pulmonary disease: Secondary | ICD-10-CM | POA: Diagnosis not present

## 2024-02-20 NOTE — Patient Instructions (Signed)
 No change in recommends except you should consider bed blocks for your headboard - 6 to 8 inches   Please schedule a follow up visit in  12  months but call sooner if needed

## 2024-02-21 NOTE — Assessment & Plan Note (Addendum)
 Onset 2008 while smoking improved some but never completely better  - 11/04/2023  After extensive coaching inhaler device,  effectiveness =    75% with hfa/ start symbicort  80  2bid  - PFTs 11/18/23  c/w mild asthma flows nl p saba on day she did not take symbicort  with ERV 7% at wt 220   All goals of chronic asthma control met including optimal function and elimination of symptoms with minimal need for rescue therapy.  Contingencies discussed in full including contacting this office immediately if not controlling the symptoms using the rule of two's.     Advised bed blocks to help offet her abd wt distribution from affecting her diaphragms or contributing to noct GERD    F/u can be q 12 months, sooner if needed

## 2024-02-27 ENCOUNTER — Inpatient Hospital Stay: Admission: RE | Admit: 2024-02-27 | Source: Ambulatory Visit

## 2024-03-07 ENCOUNTER — Other Ambulatory Visit: Payer: Self-pay

## 2024-03-07 ENCOUNTER — Telehealth: Payer: Self-pay | Admitting: Orthopaedic Surgery

## 2024-03-07 ENCOUNTER — Ambulatory Visit: Admitting: Orthopaedic Surgery

## 2024-03-07 DIAGNOSIS — G5603 Carpal tunnel syndrome, bilateral upper limbs: Secondary | ICD-10-CM

## 2024-03-07 DIAGNOSIS — G5602 Carpal tunnel syndrome, left upper limb: Secondary | ICD-10-CM

## 2024-03-07 DIAGNOSIS — G5601 Carpal tunnel syndrome, right upper limb: Secondary | ICD-10-CM | POA: Diagnosis not present

## 2024-03-07 DIAGNOSIS — Z96641 Presence of right artificial hip joint: Secondary | ICD-10-CM | POA: Diagnosis not present

## 2024-03-07 DIAGNOSIS — M7061 Trochanteric bursitis, right hip: Secondary | ICD-10-CM | POA: Insufficient documentation

## 2024-03-07 MED ORDER — METHYLPREDNISOLONE 4 MG PO TBPK: ORAL_TABLET | ORAL | 0 refills | Status: AC

## 2024-03-07 NOTE — Telephone Encounter (Signed)
 Faxed to number provided

## 2024-03-07 NOTE — Telephone Encounter (Signed)
 Patient was here. She needs a note stating that she was seen here today faxed to her job. [phone]

## 2024-03-07 NOTE — Progress Notes (Signed)
 Office Visit Note   Patient: Ellen Nelson           Date of Birth: [birth year]           MRN: [id number] Visit Date: 03/07/2024              Requested by: Lendia Boby CROME, NP-C [address] [place],  [place] [address] PCP: Lendia Boby CROME, NP-C   Assessment & Plan: Visit Diagnoses:  1. Status post total replacement of right hip   2. Trochanteric bursitis, right hip   3. Right carpal tunnel syndrome   4. Left carpal tunnel syndrome     Plan: History of Present Illness Ellen Nelson is a 68 year old female with a history of right hip replacement 2 years ago who presents with right hip pain.  She experiences pain in the right hip, localized to the front and groin area, with episodes of catching that require her to walk it off. This has occurred twice. She suspects prolonged standing at her job, where she is on her feet for almost eleven hours a day, may contribute to the pain.  The pain is also localized to a small spot on the lateral aspect of her hip, where she previously received injections. She has gained approximately forty pounds, now weighing 224 pounds, which she acknowledges may be contributing to her hip issues.  She is also reporting worsening bilateral carpal tunnel symptoms.  Had EMGs a few years ago that showed moderate disease.  She has been managing since then.  Physical Exam MEASUREMENTS: Weight- 224.  Results RADIOLOGY Hip X-ray: Components intact, no shifting, subsiding, or loosening  Assessment and Plan Right hip pain after hip arthroplasty with trochanteric bursitis and/or tendinitis Right hip pain localized to the trochanteric area, likely due to bursitis or tendinitis. X-rays confirmed stable hip implant with no loosening or subsidence. - Prescribe 6-day course of oral steroids. - Advise activity modification and weight reduction. - Recommend physical therapy and stretching exercises.  Right carpal tunnel syndrome Moderate right carpal tunnel  syndrome confirmed by nerve studies, symptoms have worsened over time. - Discuss surgical intervention for carpal tunnel syndrome when she is ready. - Detailed surgical plan discussed. - Debbie to call patient to confirm surgery date.  Follow-Up Instructions: No follow-ups on file.   Orders:  Orders Placed This Encounter  Procedures   XR Pelvis 1-2 Views   Meds ordered this encounter  Medications   methylPREDNISolone  (MEDROL  DOSEPAK) 4 MG TBPK tablet    Sig: Take as directed    Dispense:  21 tablet    Refill:  0      Procedures: No procedures performed   Clinical Data: No additional findings.   Subjective: Chief Complaint  Patient presents with   Right Hip - Pain    Right THA 02/15/2022   Right Wrist - Pain   Left Wrist - Pain    HPI  Review of Systems  Constitutional: Negative.   HENT: Negative.    Eyes: Negative.   Respiratory: Negative.    Cardiovascular: Negative.   Endocrine: Negative.   Musculoskeletal: Negative.   Neurological: Negative.   Hematological: Negative.   Psychiatric/Behavioral: Negative.    All other systems reviewed and are negative.    Objective: Vital Signs: There were no vitals taken for this visit.  Physical Exam Vitals and nursing note reviewed.  Constitutional:      Appearance: She is well-developed.  HENT:     Head: Atraumatic.  Nose: Nose normal.  Eyes:     Extraocular Movements: Extraocular movements intact.  Cardiovascular:     Pulses: Normal pulses.  Pulmonary:     Effort: Pulmonary effort is normal.  Abdominal:     Palpations: Abdomen is soft.  Musculoskeletal:     Cervical back: Neck supple.  Skin:    General: Skin is warm.     Capillary Refill: Capillary refill takes less than 2 seconds.  Neurological:     Mental Status: She is alert. Mental status is at baseline.  Psychiatric:        Behavior: Behavior normal.        Thought Content: Thought content normal.        Judgment: Judgment normal.      Ortho Exam  Specialty Comments:  No specialty comments available.  Imaging: XR Pelvis 1-2 Views Result Date: 03/07/2024 Stable total hip replacement without complications    PMFS History: Patient Active Problem List   Diagnosis Date Noted   Trochanteric bursitis, right hip 03/07/2024   Asthmatic bronchitis , chronic (HCC) 11/04/2023   Right carpal tunnel syndrome 08/03/2023   Left carpal tunnel syndrome 08/03/2023   Status post total replacement of right hip 02/15/2022   Primary osteoarthritis of right hip 01/12/2022   Bilateral hand pain 11/21/2020   Positive ANA (antinuclear antibody) 11/21/2020   Nonspecific syndrome suggestive of viral illness 05/31/2020   Biceps tendinopathy of right upper extremity 12/04/2019   Complete tear of right rotator cuff 09/05/2019   Impingement syndrome of right shoulder 09/05/2019   Past Medical History:  Diagnosis Date   Allergy    Arthritis    Asthma    Hypertension    Mitral prolapse    Per patient   Rotator cuff tear    Right shoulder    Family History  Problem Relation Age of Onset   Colon polyps Mother    Hypertension Mother    Hiatal hernia Mother    Heart disease Mother    Colon polyps Father    Colon cancer Father    Dementia Father    Hypertension Father    Cancer - Colon Father    Osteoarthritis Sister    Breast cancer Sister    Heart disease Brother    Early death Brother    Esophageal cancer Neg Hx    Stomach cancer Neg Hx    Rectal cancer Neg Hx     Past Surgical History:  Procedure Laterality Date   ABDOMINAL HYSTERECTOMY     COLONOSCOPY     RETAINED PLACENTA REMOVAL     32 years ago per patient   TONSILLECTOMY     TOTAL HIP ARTHROPLASTY Right 02/15/2022   Procedure: RIGHT TOTAL HIP ARTHROPLASTY ANTERIOR APPROACH;  Surgeon: Jerri Kay HERO, MD;  Location: [facility] OR;  Service: Orthopedics;  Laterality: Right;  3-C   Social History   Occupational History   Occupation: waitress  Tobacco Use    Smoking status: Former    Current packs/day: 0.00    Types: Cigarettes    Quit date: 2008    Years since quitting: 17.8   Smokeless tobacco: Never  Vaping Use   Vaping status: Never Used  Substance and Sexual Activity   Alcohol use: Not Currently   Drug use: Never   Sexual activity: Not Currently    Birth control/protection: Post-menopausal

## 2024-03-08 ENCOUNTER — Inpatient Hospital Stay: Admission: RE | Admit: 2024-03-08 | Source: Ambulatory Visit

## 2024-03-16 ENCOUNTER — Ambulatory Visit (INDEPENDENT_AMBULATORY_CARE_PROVIDER_SITE_OTHER)
Admission: RE | Admit: 2024-03-16 | Discharge: 2024-03-16 | Disposition: A | Discharge status: Home / Self Care | Source: Ambulatory Visit | Attending: Family Medicine | Admitting: Family Medicine

## 2024-03-16 DIAGNOSIS — E2839 Other primary ovarian failure: Secondary | ICD-10-CM

## 2024-03-19 DIAGNOSIS — I2510 Atherosclerotic heart disease of native coronary artery without angina pectoris: Secondary | ICD-10-CM | POA: Diagnosis not present

## 2024-03-19 DIAGNOSIS — E782 Mixed hyperlipidemia: Secondary | ICD-10-CM | POA: Diagnosis not present

## 2024-03-19 DIAGNOSIS — M150 Primary generalized (osteo)arthritis: Secondary | ICD-10-CM | POA: Diagnosis not present

## 2024-03-19 DIAGNOSIS — I10 Essential (primary) hypertension: Secondary | ICD-10-CM | POA: Diagnosis not present

## 2024-03-26 ENCOUNTER — Encounter: Payer: Self-pay | Admitting: Radiology

## 2024-04-17 ENCOUNTER — Telehealth: Payer: Self-pay | Admitting: Internal Medicine

## 2024-04-17 NOTE — Telephone Encounter (Signed)
 Called patient to offer surgery date for right carpal tunnel.  She says she is coming in the office to be seen for her shoulder tomorrow and will ask for my card and then call when ready to schedule the carpal tunnel release.  She will need to make arrangements well in advance to have coverage with her job.

## 2024-04-18 ENCOUNTER — Encounter: Admitting: Family Medicine

## 2024-04-18 ENCOUNTER — Ambulatory Visit (INDEPENDENT_AMBULATORY_CARE_PROVIDER_SITE_OTHER): Payer: Self-pay

## 2024-04-18 ENCOUNTER — Ambulatory Visit: Admitting: Sports Medicine

## 2024-04-18 ENCOUNTER — Other Ambulatory Visit: Payer: Self-pay

## 2024-04-18 ENCOUNTER — Encounter: Payer: Self-pay | Admitting: Sports Medicine

## 2024-04-18 ENCOUNTER — Ambulatory Visit: Admitting: Orthopaedic Surgery

## 2024-04-18 DIAGNOSIS — M25511 Pain in right shoulder: Secondary | ICD-10-CM

## 2024-04-18 DIAGNOSIS — M19011 Primary osteoarthritis, right shoulder: Secondary | ICD-10-CM

## 2024-04-18 DIAGNOSIS — G8929 Other chronic pain: Secondary | ICD-10-CM

## 2024-04-18 MED ORDER — METHYLPREDNISOLONE ACETATE 40 MG/ML IJ SUSP
80.0000 mg | INTRAMUSCULAR | Status: AC | PRN
  Administered 2024-04-18: 80 mg via INTRA_ARTICULAR

## 2024-04-18 MED ORDER — LIDOCAINE HCL 1 % IJ SOLN
2.0000 mL | INTRAMUSCULAR | Status: AC | PRN
  Administered 2024-04-18: 2 mL

## 2024-04-18 MED ORDER — BUPIVACAINE HCL 0.25 % IJ SOLN
2.0000 mL | INTRAMUSCULAR | Status: AC | PRN
  Administered 2024-04-18: 2 mL via INTRA_ARTICULAR

## 2024-04-18 NOTE — Progress Notes (Signed)
 Office Visit Note   Patient: Ellen Nelson           Date of Birth: 05/04/1956           MRN: [id number] Visit Date: 04/18/2024              Requested by: Lendia Boby CROME, NP-C [address] [place],  [place] [address] PCP: Lendia Boby CROME, NP-C   Assessment & Plan: Visit Diagnoses:  1. Chronic right shoulder pain     Plan: History of Present Illness Ellen Nelson is a 68 year old female with a chronic shoulder pain who presents with right shoulder pain.  She has had right shoulder pain for about seven years, described as a sensation of the bone popping out. She has not had shoulder surgery. A 2021 MRI showed a chronic irreparable rotator cuff tear in the right shoulder.  She has difficulty lifting her arm and notes a persistent protrusion in the shoulder that she believes is swelling. Previously this swelling would resolve on its own, but it no longer subsides.  She also reports a separate problem with her right hand.  Physical Exam MUSCULOSKELETAL: Examination of right shoulder shows near normal passive range of motion with pain.  Decreased active range of motion due to weakness.  Manual muscle testing of the rotator cuff shows weakness of the supraspinatus and infraspinatus.  Results RADIOLOGY Right shoulder MRI: Chronic rotator cuff tear, not repairable (2021)  Assessment and Plan Right shoulder rotator cuff arthropathy with chronic, irreparable rotator cuff tear Chronic condition with recent exacerbation. Swelling and protrusion likely from soft tissue movement. No significant functional impairment. - Referred to Dr. Burnetta for cortisone injection. - Referred to physical therapy for shoulder strengthening exercises.  Follow-Up Instructions: No follow-ups on file.   Orders:  Orders Placed This Encounter  Procedures   XR Shoulder Right   Ambulatory referral to Physical Therapy   No orders of the defined types were placed in this encounter.      Procedures: No procedures performed   Clinical Data: No additional findings.   Subjective: Chief Complaint  Patient presents with   Right Shoulder - Pain    HPI  Review of Systems  Constitutional: Negative.   HENT: Negative.    Eyes: Negative.   Respiratory: Negative.    Cardiovascular: Negative.   Endocrine: Negative.   Musculoskeletal: Negative.   Neurological: Negative.   Hematological: Negative.   Psychiatric/Behavioral: Negative.    All other systems reviewed and are negative.    Objective: Vital Signs: There were no vitals taken for this visit.  Physical Exam Vitals and nursing note reviewed.  Constitutional:      Appearance: She is well-developed.  HENT:     Head: Atraumatic.     Nose: Nose normal.  Eyes:     Extraocular Movements: Extraocular movements intact.  Cardiovascular:     Pulses: Normal pulses.  Pulmonary:     Effort: Pulmonary effort is normal.  Abdominal:     Palpations: Abdomen is soft.  Musculoskeletal:     Cervical back: Neck supple.  Skin:    General: Skin is warm.     Capillary Refill: Capillary refill takes less than 2 seconds.  Neurological:     Mental Status: She is alert. Mental status is at baseline.  Psychiatric:        Behavior: Behavior normal.        Thought Content: Thought content normal.  Judgment: Judgment normal.     Ortho Exam  Specialty Comments:  No specialty comments available.  Imaging: XR Shoulder Right Result Date: 04/18/2024 X-rays of the shoulder show a mild degenerative changes of the glenohumeral and AC joint.    PMFS History: Patient Active Problem List   Diagnosis Date Noted   Trochanteric bursitis, right hip 03/07/2024   Asthmatic bronchitis , chronic (HCC) 11/04/2023   Right carpal tunnel syndrome 08/03/2023   Left carpal tunnel syndrome 08/03/2023   Status post total replacement of right hip 02/15/2022   Primary osteoarthritis of right hip 01/12/2022   Bilateral hand pain  11/21/2020   Positive ANA (antinuclear antibody) 11/21/2020   Nonspecific syndrome suggestive of viral illness 05/31/2020   Biceps tendinopathy of right upper extremity 12/04/2019   Complete tear of right rotator cuff 09/05/2019   Impingement syndrome of right shoulder 09/05/2019   Past Medical History:  Diagnosis Date   Allergy    Arthritis    Asthma    Hypertension    Mitral prolapse    Per patient   Rotator cuff tear    Right shoulder    Family History  Problem Relation Age of Onset   Colon polyps Mother    Hypertension Mother    Hiatal hernia Mother    Heart disease Mother    Colon polyps Father    Colon cancer Father    Dementia Father    Hypertension Father    Cancer - Colon Father    Osteoarthritis Sister    Breast cancer Sister    Heart disease Brother    Early death Brother    Esophageal cancer Neg Hx    Stomach cancer Neg Hx    Rectal cancer Neg Hx     Past Surgical History:  Procedure Laterality Date   ABDOMINAL HYSTERECTOMY     COLONOSCOPY     RETAINED PLACENTA REMOVAL     32 years ago per patient   TONSILLECTOMY     TOTAL HIP ARTHROPLASTY Right 02/15/2022   Procedure: RIGHT TOTAL HIP ARTHROPLASTY ANTERIOR APPROACH;  Surgeon: Jerri Kay HERO, MD;  Location: [facility] OR;  Service: Orthopedics;  Laterality: Right;  3-C   Social History   Occupational History   Occupation: waitress  Tobacco Use   Smoking status: Former    Current packs/day: 0.00    Types: Cigarettes    Quit date: 2008    Years since quitting: 17.9   Smokeless tobacco: Never  Vaping Use   Vaping status: Never Used  Substance and Sexual Activity   Alcohol use: Not Currently   Drug use: Never   Sexual activity: Not Currently    Birth control/protection: Post-menopausal

## 2024-04-18 NOTE — Progress Notes (Addendum)
   Procedure Note  Patient: Ellen Nelson             Date of Birth: 1955-12-25           MRN: [id number]             Visit Date: 04/18/2024  Procedures: Visit Diagnoses:  1. Chronic right shoulder pain   2. Primary osteoarthritis, right shoulder    Large Joint Inj: R glenohumeral on 04/18/2024 8:47 AM Indications: pain Details: 22 G 3.5 in needle, ultrasound-guided posterior approach Medications: 2 mL lidocaine  1 %; 2 mL bupivacaine  0.25 %; 80 mg methylPREDNISolone  acetate 40 MG/ML Outcome: tolerated well, no immediate complications  US -guided glenohumeral joint injection, right shoulder After discussion on risks/benefits/indications, informed verbal consent was obtained. A timeout was then performed. The patient was positioned lying lateral recumbent on examination table. The patient's shoulder was prepped with betadine  and multiple alcohol swabs  and utilizing ultrasound guidance, the patient's glenohumeral joint was identified on ultrasound. Using ultrasound guidance a 22-gauge, 3.5 inch needle with a mixture of 2:2:2 cc's lidocaine :bupivicaine:depomedrol was directed from a lateral to medial direction via in-plane technique into the glenohumeral joint with visualization of appropriate spread of injectate into the joint. Patient tolerated the procedure well without immediate complications.      Procedure, treatment alternatives, risks and benefits explained, specific risks discussed. Consent was given by the patient. Immediately prior to procedure a time out was called to verify the correct patient, procedure, equipment, support staff and site/side marked as required. Patient was prepped and draped in the usual sterile fashion.     - patient tolerated procedure well, discussed post-injection protocol - follow-up with Dr. Jerri for shoulder as indicated; I am happy to see them as needed - note provided for work  Lonell Sprang, DO Primary Care Sports Medicine Physician  [facility] - Orthopedics  This note was dictated using Dragon naturally speaking software and may contain errors in syntax, spelling, or content which have not been identified prior to signing this note.

## 2024-05-16 ENCOUNTER — Ambulatory Visit

## 2024-05-23 ENCOUNTER — Ambulatory Visit

## 2024-05-31 ENCOUNTER — Ambulatory Visit: Admitting: Physical Therapy

## 2024-06-13 ENCOUNTER — Other Ambulatory Visit: Payer: Self-pay

## 2024-06-13 ENCOUNTER — Ambulatory Visit: Attending: Orthopaedic Surgery

## 2024-06-13 DIAGNOSIS — M25511 Pain in right shoulder: Secondary | ICD-10-CM | POA: Diagnosis present

## 2024-06-13 DIAGNOSIS — M6281 Muscle weakness (generalized): Secondary | ICD-10-CM | POA: Diagnosis present

## 2024-06-13 DIAGNOSIS — G8929 Other chronic pain: Secondary | ICD-10-CM | POA: Insufficient documentation
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
## 2024-06-20 ENCOUNTER — Ambulatory Visit: Admitting: Physical Therapy

## 2024-06-21 ENCOUNTER — Ambulatory Visit: Admitting: Physical Therapy

## 2024-06-21 ENCOUNTER — Encounter: Payer: Self-pay | Admitting: Physical Therapy

## 2024-06-21 DIAGNOSIS — M6281 Muscle weakness (generalized): Secondary | ICD-10-CM

## 2024-06-21 DIAGNOSIS — G8929 Other chronic pain: Secondary | ICD-10-CM

## 2024-06-21 DIAGNOSIS — M25511 Pain in right shoulder: Secondary | ICD-10-CM | POA: Diagnosis not present
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
## 2024-06-27 ENCOUNTER — Ambulatory Visit: Admitting: Physical Therapy

## 2024-06-28 ENCOUNTER — Ambulatory Visit: Admitting: Physical Therapy

## 2024-07-03 ENCOUNTER — Ambulatory Visit: Admitting: Physical Therapy

## 2024-07-05 ENCOUNTER — Ambulatory Visit: Admitting: Physical Therapy

## 2025-02-01 ENCOUNTER — Ambulatory Visit

## 2025-02-01 ENCOUNTER — Encounter: Admitting: Family Medicine
